# Patient Record
Sex: Female | Born: 1985 | Race: White | Hispanic: No | Marital: Married | Smoking: Former smoker
Health system: Southern US, Community
[De-identification: ages and names within clinical notes are randomized; demographics above are authoritative.]

## PROBLEM LIST (undated history)

## (undated) DIAGNOSIS — F419 Anxiety disorder, unspecified: Secondary | ICD-10-CM

## (undated) DIAGNOSIS — F32A Depression, unspecified: Secondary | ICD-10-CM

## (undated) DIAGNOSIS — T7840XA Allergy, unspecified, initial encounter: Secondary | ICD-10-CM

## (undated) DIAGNOSIS — R112 Nausea with vomiting, unspecified: Secondary | ICD-10-CM

## (undated) DIAGNOSIS — G43909 Migraine, unspecified, not intractable, without status migrainosus: Secondary | ICD-10-CM

## (undated) DIAGNOSIS — F329 Major depressive disorder, single episode, unspecified: Secondary | ICD-10-CM

## (undated) DIAGNOSIS — K219 Gastro-esophageal reflux disease without esophagitis: Secondary | ICD-10-CM

## (undated) DIAGNOSIS — Z9889 Other specified postprocedural states: Secondary | ICD-10-CM

## (undated) HISTORY — PX: FOOT TENDON SURGERY: SHX958

## (undated) HISTORY — DX: Depression, unspecified: F32.A

## (undated) HISTORY — DX: Allergy, unspecified, initial encounter: T78.40XA

## (undated) HISTORY — PX: ABDOMINAL HYSTERECTOMY: SHX81

## (undated) HISTORY — DX: Major depressive disorder, single episode, unspecified: F32.9

---

## 2008-11-27 ENCOUNTER — Inpatient Hospital Stay: Payer: Self-pay | Admitting: Obstetrics and Gynecology

## 2008-12-02 LAB — HM PAP SMEAR: HM Pap smear: NORMAL

## 2009-09-01 HISTORY — PX: DILATION AND CURETTAGE OF UTERUS: SHX78

## 2009-09-03 ENCOUNTER — Ambulatory Visit: Payer: Self-pay | Admitting: Unknown Physician Specialty

## 2009-09-03 HISTORY — PX: LAPAROSCOPY: SHX197

## 2010-03-14 ENCOUNTER — Emergency Department (HOSPITAL_COMMUNITY)
Admission: EM | Admit: 2010-03-14 | Discharge: 2010-03-15 | Payer: Self-pay | Source: Home / Self Care | Admitting: Emergency Medicine

## 2010-05-03 ENCOUNTER — Emergency Department (HOSPITAL_COMMUNITY)
Admission: EM | Admit: 2010-05-03 | Discharge: 2010-05-04 | Disposition: A | Discharge status: Home / Self Care | Payer: BC Managed Care – PPO | Attending: Emergency Medicine | Admitting: Emergency Medicine

## 2010-05-03 DIAGNOSIS — R112 Nausea with vomiting, unspecified: Secondary | ICD-10-CM | POA: Insufficient documentation

## 2010-05-03 DIAGNOSIS — R109 Unspecified abdominal pain: Secondary | ICD-10-CM | POA: Insufficient documentation

## 2010-05-03 DIAGNOSIS — K5900 Constipation, unspecified: Secondary | ICD-10-CM | POA: Insufficient documentation

## 2010-05-04 ENCOUNTER — Emergency Department (HOSPITAL_COMMUNITY)
Admit: 2010-05-04 | Discharge: 2010-05-04 | Disposition: A | Discharge status: Home / Self Care | Payer: BC Managed Care – PPO

## 2011-01-03 ENCOUNTER — Other Ambulatory Visit (HOSPITAL_COMMUNITY)
Admission: RE | Admit: 2011-01-03 | Discharge: 2011-01-03 | Disposition: A | Discharge status: Home / Self Care | Payer: BC Managed Care – PPO | Source: Ambulatory Visit | Attending: Internal Medicine | Admitting: Internal Medicine

## 2011-01-03 ENCOUNTER — Encounter: Payer: Self-pay | Admitting: Internal Medicine

## 2011-01-03 ENCOUNTER — Ambulatory Visit (INDEPENDENT_AMBULATORY_CARE_PROVIDER_SITE_OTHER): Payer: BC Managed Care – PPO | Admitting: Internal Medicine

## 2011-01-03 VITALS — BP 126/77 | HR 61 | Temp 97.6°F | Resp 16 | Ht 65.0 in | Wt 184.2 lb

## 2011-01-03 DIAGNOSIS — E669 Obesity, unspecified: Secondary | ICD-10-CM

## 2011-01-03 DIAGNOSIS — F329 Major depressive disorder, single episode, unspecified: Secondary | ICD-10-CM

## 2011-01-03 DIAGNOSIS — Z8669 Personal history of other diseases of the nervous system and sense organs: Secondary | ICD-10-CM | POA: Insufficient documentation

## 2011-01-03 DIAGNOSIS — Z01419 Encounter for gynecological examination (general) (routine) without abnormal findings: Secondary | ICD-10-CM | POA: Insufficient documentation

## 2011-01-03 DIAGNOSIS — Z0000 Encounter for general adult medical examination without abnormal findings: Secondary | ICD-10-CM

## 2011-01-03 DIAGNOSIS — Z3002 Counseling and instruction in natural family planning to avoid pregnancy: Secondary | ICD-10-CM

## 2011-01-03 DIAGNOSIS — Z1159 Encounter for screening for other viral diseases: Secondary | ICD-10-CM | POA: Insufficient documentation

## 2011-01-03 DIAGNOSIS — T7840XA Allergy, unspecified, initial encounter: Secondary | ICD-10-CM | POA: Insufficient documentation

## 2011-01-03 DIAGNOSIS — N760 Acute vaginitis: Secondary | ICD-10-CM | POA: Insufficient documentation

## 2011-01-03 DIAGNOSIS — K219 Gastro-esophageal reflux disease without esophagitis: Secondary | ICD-10-CM

## 2011-01-03 DIAGNOSIS — Z30018 Encounter for initial prescription of other contraceptives: Secondary | ICD-10-CM | POA: Insufficient documentation

## 2011-01-03 DIAGNOSIS — F32A Depression, unspecified: Secondary | ICD-10-CM

## 2011-01-03 DIAGNOSIS — G43909 Migraine, unspecified, not intractable, without status migrainosus: Secondary | ICD-10-CM

## 2011-01-03 DIAGNOSIS — Z124 Encounter for screening for malignant neoplasm of cervix: Secondary | ICD-10-CM | POA: Insufficient documentation

## 2011-01-03 LAB — TSH: TSH: 0.79 u[IU]/mL (ref 0.35–5.50)

## 2011-01-03 LAB — COMPREHENSIVE METABOLIC PANEL
ALT: 19 U/L (ref 0–35)
AST: 16 U/L (ref 0–37)
Alkaline Phosphatase: 37 U/L — ABNORMAL LOW (ref 39–117)
BUN: 5 mg/dL — ABNORMAL LOW (ref 6–23)
Creatinine, Ser: 0.6 mg/dL (ref 0.4–1.2)
Potassium: 3.2 mEq/L — ABNORMAL LOW (ref 3.5–5.1)

## 2011-01-03 LAB — LIPID PANEL
HDL: 60.2 mg/dL (ref >39.00)
LDL Cholesterol: 80 mg/dL (ref 0–99)
Total CHOL/HDL Ratio: 3
Triglycerides: 73 mg/dL (ref 0.0–149.0)

## 2011-01-03 NOTE — Patient Instructions (Signed)
EAS makes a protein shake premixed, that has 110 cal  3 carbs  17 g protein. Drink one    Atkins protein bars (snack sized) are full off iber,  Low in carbs  And 130-150 cal. Eat one between classes. Lunch should be a salad,  A sandwich on low carb bread  (try Joseph's pita bread availale at [facility]), yogurt or another protein bar.  Another snack in the afternoon.  Can be pistachios, almonds. 1/4 cup .   Dinner should be a meat (or fish or poultry) , a salad and a green vegetable , squash cabage,  cauliflowe ok)  Limit your corn on the cob to one ear bc of the sugar content.   You need to exercise for 25 minutes 4 or 5 times per week .  thsi can be power walking 9walking at a fast rate so yo are short of breath), jumping on a trampoline, jump roping, or going to a gym.   Losing 4 lbs per month is an excellent rate for a woman.

## 2011-01-03 NOTE — Assessment & Plan Note (Signed)
Patient is not exercising or eating regularly and has gained 30 lbs since the birth of her daughter 2 yrs ago.  Spent 15 minutes discussing role of diet and exercise and dissuaded her form use of appetite suppressants at this time.

## 2011-01-03 NOTE — Progress Notes (Signed)
  Subjective:    Patient ID: Jamie Weber, female    DOB: 1985-09-12, 25 y.o.   MRN: [id number]  HPI  25 yo wf here for annual physical with PAP. Has gained 30 lbs over the lst year and requesting help with medication.  Does not eat breakfast or lunch,  Eats only 1 meal daily.  Does not exercise.  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  Has a 66 yr old daughter , delivered Oct 2010, now using Implanon for birth control.     Review of Systems  Constitutional: Negative for fever, chills and unexpected weight change.  HENT: Negative for hearing loss, ear pain, nosebleeds, congestion, sore throat, facial swelling, rhinorrhea, sneezing, mouth sores, trouble swallowing, neck pain, neck stiffness, voice change, postnasal drip, sinus pressure, tinnitus and ear discharge.   Eyes: Negative for pain, discharge, redness and visual disturbance.  Respiratory: Negative for cough, chest tightness, shortness of breath, wheezing and stridor.   Cardiovascular: Negative for chest pain, palpitations and leg swelling.  Genitourinary: Positive for menstrual problem.  Musculoskeletal: Negative for myalgias and arthralgias.  Skin: Negative for color change and rash.  Neurological: Negative for dizziness, weakness, light-headedness and headaches.  Hematological: Negative for adenopathy.  All other systems reviewed and are negative.       Objective:   Physical Exam  Constitutional: She is oriented to person, place, and time. She appears well-developed and well-nourished.  HENT:  Mouth/Throat: Oropharynx is clear and moist.  Eyes: EOM are normal. Pupils are equal, round, and reactive to light. No scleral icterus.  Neck: Normal range of motion. Neck supple. No JVD present. No thyromegaly present.  Cardiovascular: Normal rate, regular rhythm, normal heart sounds and intact distal pulses.   Pulmonary/Chest: Effort  normal and breath sounds normal.  Abdominal: Soft. Bowel sounds are normal. She exhibits no mass. There is no tenderness.  Genitourinary: Vagina normal and uterus normal. No vaginal discharge found.  Musculoskeletal: Normal range of motion. She exhibits no edema.  Lymphadenopathy:    She has no cervical adenopathy.  Neurological: She is alert and oriented to person, place, and time.  Skin: Skin is warm and dry.     Psychiatric: She has a normal mood and affect.          Assessment & Plan:

## 2011-01-05 LAB — HEMOGLOBIN A1C: Hgb A1c MFr Bld: 5.5 % (ref 4.6–6.5)

## 2011-01-10 ENCOUNTER — Encounter: Payer: Self-pay | Admitting: Internal Medicine

## 2011-01-13 ENCOUNTER — Other Ambulatory Visit: Payer: Self-pay | Admitting: *Deleted

## 2011-01-13 MED ORDER — POTASSIUM CHLORIDE 20 MEQ PO PACK: 20.0000 meq | PACK | Freq: Every day | ORAL | Status: DC

## 2011-01-29 ENCOUNTER — Emergency Department: Payer: Self-pay | Admitting: Emergency Medicine

## 2011-02-27 ENCOUNTER — Telehealth: Payer: Self-pay | Admitting: Internal Medicine

## 2011-02-27 NOTE — Telephone Encounter (Signed)
Left message asking patient to return my call.

## 2011-02-27 NOTE — Telephone Encounter (Signed)
[phone] Pt called wanted to know if she needs to see dr Darrick Huntsman or chiropractor She got a cold 2 weeks ago she has gotten over that.  She went to bend over the tub to give her child a bath.  She has lower back pain and her right leg hurts like a throbbing pain.  When she cough her back hurts

## 2011-02-28 NOTE — Telephone Encounter (Signed)
Spoke with patient, she says that her back and leg is starting to feel some better and wants to give it a little more time. She says that if she is not better by Friday she is going to call and schedule appt to see Dr. Darrick Huntsman.

## 2011-03-01 ENCOUNTER — Encounter: Payer: Self-pay | Admitting: Internal Medicine

## 2011-03-01 ENCOUNTER — Ambulatory Visit (INDEPENDENT_AMBULATORY_CARE_PROVIDER_SITE_OTHER): Payer: BC Managed Care – PPO | Admitting: Internal Medicine

## 2011-03-01 DIAGNOSIS — M5432 Sciatica, left side: Secondary | ICD-10-CM

## 2011-03-01 DIAGNOSIS — M5430 Sciatica, unspecified side: Secondary | ICD-10-CM

## 2011-03-01 MED ORDER — GABAPENTIN 100 MG PO CAPS: 100.0000 mg | ORAL_CAPSULE | Freq: Three times a day (TID) | ORAL | Status: DC

## 2011-03-01 MED ORDER — POTASSIUM CHLORIDE ER 10 MEQ PO TBCR: 20.0000 meq | EXTENDED_RELEASE_TABLET | Freq: Two times a day (BID) | ORAL | Status: DC

## 2011-03-01 MED ORDER — TRAMADOL HCL 50 MG PO TABS: 50.0000 mg | ORAL_TABLET | Freq: Four times a day (QID) | ORAL | Status: AC | PRN

## 2011-03-01 MED ORDER — METHOCARBAMOL 750 MG PO TABS: 750.0000 mg | ORAL_TABLET | Freq: Three times a day (TID) | ORAL | Status: AC | PRN
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
## 2011-03-01 NOTE — Patient Instructions (Addendum)
Stop the aspirin and ibuprofen for now. They are hurting your stomach  Continue the omeprazole (prilosec) daily for your stomach.    I am going to prescribe a muscle relaxer (methocarbamol),  A pill for nerve irritation (gabapentin), and  tramadol for pain   For the next two weeks. You can combine these 3 medications any way you like, but the methocarbamol and the gabapentin might make you sleepy   If you have no improvement in 2 weeks, call to set up x rays.

## 2011-03-03 ENCOUNTER — Encounter: Payer: Self-pay | Admitting: Internal Medicine

## 2011-03-03 DIAGNOSIS — M5432 Sciatica, left side: Secondary | ICD-10-CM | POA: Insufficient documentation

## 2011-04-04 DIAGNOSIS — R112 Nausea with vomiting, unspecified: Secondary | ICD-10-CM

## 2011-04-04 DIAGNOSIS — Z9889 Other specified postprocedural states: Secondary | ICD-10-CM

## 2011-04-04 HISTORY — DX: Nausea with vomiting, unspecified: Z98.890

## 2011-04-04 HISTORY — DX: Nausea with vomiting, unspecified: R11.2

## 2011-08-15 ENCOUNTER — Telehealth: Payer: Self-pay | Admitting: Internal Medicine

## 2011-08-15 NOTE — Telephone Encounter (Signed)
Caller: Alisse/Patient; PCP: Duncan Dull; CB#: [phone]; ; ; Call regarding URI, Pink Eye; Eyes not improved, has fever and URI sx with nausea  Eye drops have not worked.  Insistent on appointment today.  Contacted Vanessa at office for assistance with appointment.  Was informed per Morrie Sheldon to refer to OB/GYN since she is pregnant.  Caller informed of same, states she was told to contact PCP by OB/GYN, so she will go to a walk in clinic.

## 2011-09-26 ENCOUNTER — Observation Stay: Payer: Self-pay

## 2011-09-26 ENCOUNTER — Observation Stay: Payer: Self-pay | Admitting: Obstetrics and Gynecology

## 2011-09-26 LAB — URINALYSIS, COMPLETE
Bacteria: NONE SEEN
Bilirubin,UR: NEGATIVE
Blood: NEGATIVE
Nitrite: NEGATIVE
Ph: 7 (ref 4.5–8.0)
Protein: NEGATIVE
Specific Gravity: 1.012 (ref 1.003–1.030)
WBC UR: <1 /HPF (ref 0–5)

## 2011-10-05 ENCOUNTER — Observation Stay: Payer: Self-pay | Admitting: Obstetrics and Gynecology

## 2011-10-05 LAB — URINALYSIS, COMPLETE
Bacteria: NONE SEEN
Bilirubin,UR: NEGATIVE
Glucose,UR: NEGATIVE mg/dL (ref 0–75)
Ketone: NEGATIVE
Leukocyte Esterase: NEGATIVE
Ph: 7 (ref 4.5–8.0)
Protein: NEGATIVE
RBC,UR: NONE SEEN /HPF (ref 0–5)
Squamous Epithelial: 1
WBC UR: 1 /HPF (ref 0–5)

## 2011-10-05 LAB — FETAL FIBRONECTIN: Fetal Fibronectin: NEGATIVE

## 2011-11-20 ENCOUNTER — Other Ambulatory Visit: Payer: Self-pay | Admitting: *Deleted

## 2011-11-20 DIAGNOSIS — K219 Gastro-esophageal reflux disease without esophagitis: Secondary | ICD-10-CM

## 2011-11-20 MED ORDER — OMEPRAZOLE 40 MG PO CPDR: 40.0000 mg | DELAYED_RELEASE_CAPSULE | Freq: Every day | ORAL | Status: DC

## 2011-11-27 ENCOUNTER — Observation Stay: Payer: Self-pay | Admitting: Obstetrics & Gynecology

## 2011-11-27 LAB — URINALYSIS, COMPLETE
Bilirubin,UR: NEGATIVE
Ketone: NEGATIVE
Ph: 6 (ref 4.5–8.0)
Protein: NEGATIVE
RBC,UR: 2 /HPF (ref 0–5)
Specific Gravity: 1.011 (ref 1.003–1.030)
Squamous Epithelial: 28
WBC UR: 10 /HPF (ref 0–5)

## 2011-11-27 LAB — FETAL FIBRONECTIN
Appearance: NORMAL
Fetal Fibronectin: NEGATIVE

## 2011-12-31 ENCOUNTER — Observation Stay: Payer: Self-pay

## 2012-01-24 ENCOUNTER — Inpatient Hospital Stay: Payer: Self-pay

## 2012-01-24 LAB — CBC WITH DIFFERENTIAL/PLATELET
Basophil #: 0.1 10*3/uL (ref 0.0–0.1)
Lymphocyte #: 2.1 10*3/uL (ref 1.0–3.6)
MCHC: 34.7 g/dL (ref 32.0–36.0)
MCV: 88 fL (ref 80–100)
Monocyte #: 0.6 x10 3/mm (ref 0.2–0.9)
Monocyte %: 6.4 %
Platelet: 141 10*3/uL — ABNORMAL LOW (ref 150–440)
RBC: 4.32 10*6/uL (ref 3.80–5.20)
RDW: 14 % (ref 11.5–14.5)
WBC: 9.8 10*3/uL (ref 3.6–11.0)

## 2012-01-25 LAB — HEMATOCRIT: HCT: 32.4 % — ABNORMAL LOW (ref 35.0–47.0)

## 2012-02-09 ENCOUNTER — Other Ambulatory Visit: Payer: Self-pay | Admitting: Internal Medicine

## 2012-02-09 MED ORDER — SUMATRIPTAN SUCCINATE 50 MG PO TABS: ORAL_TABLET | ORAL | Status: DC

## 2012-02-09 NOTE — Telephone Encounter (Signed)
Pt is needing refill on Imitrex and she uses [facility] on [address]

## 2012-02-09 NOTE — Telephone Encounter (Signed)
Rx sent electronically by Dr. Darrick Huntsman, patient advised via message left on machine home.

## 2012-02-09 NOTE — Addendum Note (Signed)
Addended by: Sherlene Shams on: 02/09/2012 12:55 PM   Modules accepted: Orders

## 2012-02-09 NOTE — Telephone Encounter (Signed)
Ok to refill,  Authorized in epic 

## 2012-02-12 ENCOUNTER — Telehealth: Payer: Self-pay | Admitting: Internal Medicine

## 2012-02-12 NOTE — Telephone Encounter (Signed)
Pt was wanting to start getting the Depo injection for birth control. She has a f/u and wanted to have it in her system before her next f/u. Please call [phone]

## 2012-02-12 NOTE — Telephone Encounter (Signed)
Scheduled patient for 02/13/12 at 11:00am

## 2012-02-12 NOTE — Telephone Encounter (Signed)
She will need a urine pregnancy test on the day of the test to confirm that she is not pregnant.  i will order test and shot she can make a nurse visit for both please confirm that we have the shot available to give.

## 2012-02-13 ENCOUNTER — Ambulatory Visit: Payer: BC Managed Care – PPO | Admitting: Internal Medicine

## 2012-02-13 DIAGNOSIS — Z309 Encounter for contraceptive management, unspecified: Secondary | ICD-10-CM

## 2012-02-13 LAB — POCT URINE PREGNANCY: Preg Test, Ur: NEGATIVE

## 2012-02-13 MED ORDER — MEDROXYPROGESTERONE ACETATE 150 MG/ML IM SUSP
150.0000 mg | Freq: Once | INTRAMUSCULAR | Status: AC
  Administered 2012-02-13: 150 mg via INTRAMUSCULAR

## 2012-05-14 ENCOUNTER — Ambulatory Visit: Payer: BC Managed Care – PPO

## 2012-05-14 ENCOUNTER — Ambulatory Visit (INDEPENDENT_AMBULATORY_CARE_PROVIDER_SITE_OTHER): Payer: BC Managed Care – PPO | Admitting: *Deleted

## 2012-05-14 DIAGNOSIS — IMO0001 Reserved for inherently not codable concepts without codable children: Secondary | ICD-10-CM

## 2012-05-14 DIAGNOSIS — Z309 Encounter for contraceptive management, unspecified: Secondary | ICD-10-CM

## 2012-05-14 MED ORDER — MEDROXYPROGESTERONE ACETATE 150 MG/ML IM SUSP
150.0000 mg | Freq: Once | INTRAMUSCULAR | Status: AC
  Administered 2012-05-14: 150 mg via INTRAMUSCULAR

## 2012-07-03 ENCOUNTER — Ambulatory Visit (INDEPENDENT_AMBULATORY_CARE_PROVIDER_SITE_OTHER): Payer: BC Managed Care – PPO | Admitting: Internal Medicine

## 2012-07-03 ENCOUNTER — Encounter: Payer: Self-pay | Admitting: Internal Medicine

## 2012-07-03 VITALS — BP 126/66 | HR 66 | Temp 98.6°F | Resp 18 | Ht 65.0 in | Wt 180.5 lb

## 2012-07-03 DIAGNOSIS — Z124 Encounter for screening for malignant neoplasm of cervix: Secondary | ICD-10-CM

## 2012-07-03 NOTE — Progress Notes (Signed)
Patient ID: Jamie Weber, female   DOB: 01/04/1986, 27 y.o.   MRN: [id number]   Subjective:     Jamie Weber is a 27 y.o. female here for a routine exam.   She is receiving quarterly depo provera injections for birth control .  She delivered a healthy baby girl 5 months ago,  and her periods have resumed but have been irregular since then.  This is not an unusual situation for her.  She has had a history of irregular menses since menarche and has had multiple gynecologic evaluations for same.     Personal health questionnaire reviewed: yes.   Gynecologic History Patient's last menstrual period was 06/01/2012. Contraception: Depo-Provera injections Last Pap: 2013. Results were: normal Last mammogram: never .   Obstetric History OB History   Grav Para Term Preterm Abortions TAB SAB Ect Mult Living   2 2               The following portions of the patient's history were reviewed and updated as appropriate: allergies, current medications, past family history, past medical history, past social history, past surgical history and problem list.  Review of Systems A comprehensive review of systems was negative.    Objective:    BP 126/66  Pulse 66  Temp(Src) 98.6 F (37 C) (Oral)  Resp 18  Ht 5\' 5"  (1.651 m)  Wt 180 lb 8 oz (81.874 kg)  BMI 30.04 kg/m2  SpO2 97%  LMP 06/01/2012  General Appearance:    Alert, cooperative, no distress, appears stated age  Head:    Normocephalic, without obvious abnormality, atraumatic  Eyes:    PERRL, conjunctiva/corneas clear, EOM's intact, fundi    benign, both eyes  Ears:    Normal TM's and external ear canals, both ears  Nose:   Nares normal, septum midline, mucosa normal, no drainage    or sinus tenderness  Throat:   Lips, mucosa, and tongue normal; teeth and gums normal  Neck:   Supple, symmetrical, trachea midline, no adenopathy;    thyroid:  no enlargement/tenderness/nodules; no carotid   bruit or JVD  Back:     Symmetric, no  curvature, ROM normal, no CVA tenderness  Lungs:     Clear to auscultation bilaterally, respirations unlabored  Chest Wall:    No tenderness or deformity   Heart:    Regular rate and rhythm, S1 and S2 normal, no murmur, rub   or gallop  Breast Exam:    No tenderness, masses, or nipple abnormality  Abdomen:     Soft, non-tender, bowel sounds active all four quadrants,    no masses, no organomegaly  Genitalia:    Normal female without lesion, discharge or tenderness  Rectal:    Normal tone, normal prostate, no masses or tenderness;   guaiac negative stool  Extremities:   Extremities normal, atraumatic, no cyanosis or edema  Pulses:   2+ and symmetric all extremities  Skin:   Skin color, texture, turgor normal, no rashes or lesions  Lymph nodes:   Cervical, supraclavicular, and axillary nodes normal  Neurologic:   CNII-XII intact, normal strength, sensation and reflexes    throughout      Assessment:    Healthy female exam.    Plan:    Contraception: Continue Depo-Provera injections.

## 2012-07-04 ENCOUNTER — Other Ambulatory Visit (HOSPITAL_COMMUNITY)
Admission: RE | Admit: 2012-07-04 | Discharge: 2012-07-04 | Disposition: A | Discharge status: Home / Self Care | Payer: BC Managed Care – PPO | Source: Ambulatory Visit | Attending: Internal Medicine | Admitting: Internal Medicine

## 2012-07-04 DIAGNOSIS — Z01419 Encounter for gynecological examination (general) (routine) without abnormal findings: Secondary | ICD-10-CM | POA: Insufficient documentation

## 2012-07-04 DIAGNOSIS — Z1151 Encounter for screening for human papillomavirus (HPV): Secondary | ICD-10-CM | POA: Insufficient documentation

## 2012-07-09 ENCOUNTER — Encounter: Payer: Self-pay | Admitting: *Deleted

## 2012-08-01 ENCOUNTER — Emergency Department: Payer: Self-pay | Admitting: Emergency Medicine

## 2012-08-01 LAB — URINALYSIS, COMPLETE
Bilirubin,UR: NEGATIVE
Nitrite: NEGATIVE
Protein: 100
Specific Gravity: 1.025 (ref 1.003–1.030)
Squamous Epithelial: 3
WBC UR: 5 /HPF (ref 0–5)

## 2012-08-01 LAB — CBC
HGB: 13.9 g/dL (ref 12.0–16.0)
MCHC: 33.7 g/dL (ref 32.0–36.0)
MCV: 88 fL (ref 80–100)
WBC: 7.1 10*3/uL (ref 3.6–11.0)

## 2012-08-06 ENCOUNTER — Encounter: Payer: Self-pay | Admitting: Internal Medicine

## 2012-08-06 ENCOUNTER — Ambulatory Visit (INDEPENDENT_AMBULATORY_CARE_PROVIDER_SITE_OTHER): Payer: BC Managed Care – PPO | Admitting: Internal Medicine

## 2012-08-06 ENCOUNTER — Ambulatory Visit: Payer: BC Managed Care – PPO

## 2012-08-06 VITALS — BP 124/68 | HR 60 | Temp 98.6°F | Resp 14 | Wt 185.2 lb

## 2012-08-06 DIAGNOSIS — Z30018 Encounter for initial prescription of other contraceptives: Secondary | ICD-10-CM

## 2012-08-06 DIAGNOSIS — Z309 Encounter for contraceptive management, unspecified: Secondary | ICD-10-CM

## 2012-08-06 DIAGNOSIS — F411 Generalized anxiety disorder: Secondary | ICD-10-CM

## 2012-08-06 DIAGNOSIS — Z3002 Counseling and instruction in natural family planning to avoid pregnancy: Secondary | ICD-10-CM

## 2012-08-06 MED ORDER — ALPRAZOLAM 0.5 MG PO TABS: 0.5000 mg | ORAL_TABLET | Freq: Every evening | ORAL | Status: DC | PRN

## 2012-08-06 MED ORDER — CITALOPRAM HYDROBROMIDE 10 MG PO TABS: 10.0000 mg | ORAL_TABLET | Freq: Every day | ORAL | Status: DC

## 2012-08-06 MED ORDER — MEDROXYPROGESTERONE ACETATE 150 MG/ML IM SUSP
150.0000 mg | Freq: Once | INTRAMUSCULAR | Status: AC
  Administered 2012-08-06: 150 mg via INTRAMUSCULAR

## 2012-08-06 NOTE — Progress Notes (Signed)
Patient ID: Jamie Weber, female   DOB: [birth year], 27 y.o.   MRN: [id number]  Patient Active Problem List   Diagnosis Date Noted  . Generalized anxiety disorder 08/07/2012  . Sciatica of left side 03/03/2011  . Hormonal contraceptive 01/03/2011  . Obesity 01/03/2011  . Pap smear for cervical cancer screening 01/03/2011  . Obesity (BMI 30-39.9) 01/03/2011  . Hormonal contraceptive 01/03/2011  . Allergy   . Depression   . History of migraine headaches     Subjective:  CC:   Chief Complaint  Patient presents with  . Follow-up    depo    HPI:   Jamie Weber a 27 y.o. female who presents with recurrent anxiety disorder.  She has had frequent panic attacks which are occurring at night brought on by a feeling of heaviness in the chest, accompanied by breathlessness.   Not relieved by walking around.  She is requesting medication because she feels she has inherited her family's propensity for anxiety.  Her first epsiode occurred in 2012 while she was enrolled in school and taking care of baby, and involved in family conflicts and struggles.   Past Medical History  Diagnosis Date  . Allergy     history of hives controoled with zyrtec  . Depression     Past Surgical History  Procedure Laterality Date  . Laparoscopy  September 03, 2009    hysteroscopy and D&C, Dr. Harold Hedge  . Dilation and curettage of uterus  Jun 2011    normal,. [facility]    The following portions of the patient's history were reviewed and updated as appropriate: Allergies, current medications, and problem list   Review of Systems:   Patient denies headache, fevers, malaise, unintentional weight loss, skin rash, eye pain, sinus congestion and sinus pain, sore throat, dysphagia,  hemoptysis , cough, dyspnea, wheezing, chest pain, palpitations, orthopnea, edema, abdominal pain, nausea, melena, diarrhea, constipation, flank pain, dysuria, hematuria, urinary  Frequency, nocturia, numbness, tingling, seizures,  Focal  weakness, Loss of consciousness,  Tremor, insomnia, depression, anxiety, and suicidal ideation.     History   Social History  . Marital Status: Married    Spouse Name: N/A    Number of Children: N/A  . Years of Education: N/A   Occupational History  . Not on file.   Social History Main Topics  . Smoking status: Former Smoker    Quit date: 10/03/2010  . Smokeless tobacco: Never Used  . Alcohol Use: Yes     Comment: occasional  . Drug Use: No  . Sexually Active: Yes   Other Topics Concern  . Not on file   Social History Narrative  . No narrative on file    Objective:  BP 124/68  Pulse 60  Temp(Src) 98.6 F (37 C) (Oral)  Resp 14  Wt 185 lb 4 oz (84.029 kg)  BMI 30.83 kg/m2  SpO2 98%  LMP 08/06/2012  General appearance: alert, cooperative and appears stated age Ears: normal TM's and external ear canals both ears Throat: lips, mucosa, and tongue normal; teeth and gums normal Neck: no adenopathy, no carotid bruit, supple, symmetrical, trachea midline and thyroid not enlarged, symmetric, no tenderness/mass/nodules Back: symmetric, no curvature. ROM normal. No CVA tenderness. Lungs: clear to auscultation bilaterally Heart: regular rate and rhythm, S1, S2 normal, no murmur, click, rub or gallop Abdomen: soft, non-tender; bowel sounds normal; no masses,  no organomegaly Pulses: 2+ and symmetric Skin: Skin color, texture, turgor normal. No rashes or lesions Lymph nodes: Cervical, supraclavicular, and  axillary nodes normal.  Assessment and Plan:  Generalized anxiety disorder With frequent panic attacks occurring in the evening.  Trial of alprazolam.  Continue citalopram  Hormonal contraceptive Managed currently with Dep provera IM injections every 4 months    Updated Medication List Outpatient Encounter Prescriptions as of 08/06/2012  Medication Sig Dispense Refill  . omeprazole (PRILOSEC) 40 MG capsule Take 1 capsule (40 mg total) by mouth daily.  30 capsule  6   . SUMAtriptan (IMITREX) 50 MG tablet 1 tablet as needed at onset of headache. Max 2 daily  15 tablet  5  . ALPRAZolam (XANAX) 0.5 MG tablet Take 1 tablet (0.5 mg total) by mouth at bedtime as needed for anxiety.  30 tablet  3  . cetirizine (ZYRTEC) 10 MG tablet Take 10 mg by mouth daily.        . citalopram (CELEXA) 10 MG tablet Take 1 tablet (10 mg total) by mouth daily.  30 tablet  3  . gabapentin (NEURONTIN) 100 MG capsule Take 1 capsule (100 mg total) by mouth 3 (three) times daily.  90 capsule  0  . potassium chloride (K-DUR) 10 MEQ tablet Take 2 tablets (20 mEq total) by mouth 2 (two) times daily.  10 tablet  0  . [EXPIRED] medroxyPROGESTERone (DEPO-PROVERA) injection 150 mg        No facility-administered encounter medications on file as of 08/06/2012.

## 2012-08-06 NOTE — Patient Instructions (Addendum)
Please start taking citalopram in the morning with your stoamch pill.  Start with 1/2 tablet for thr first few days  Save the alprazolam for panic attacks.  Use tylenol PM for insomnia if needed

## 2012-08-07 ENCOUNTER — Encounter: Payer: Self-pay | Admitting: Internal Medicine

## 2012-08-07 DIAGNOSIS — F411 Generalized anxiety disorder: Secondary | ICD-10-CM | POA: Insufficient documentation

## 2012-08-07 NOTE — Assessment & Plan Note (Signed)
Managed currently with Dep provera IM injections every 4 months

## 2012-08-07 NOTE — Assessment & Plan Note (Signed)
With frequent panic attacks occurring in the evening.  Trial of alprazolam.  Continue citalopram

## 2012-09-07 ENCOUNTER — Other Ambulatory Visit: Payer: Self-pay | Admitting: Internal Medicine

## 2012-10-15 ENCOUNTER — Ambulatory Visit: Payer: BC Managed Care – PPO | Admitting: Adult Health

## 2012-11-01 ENCOUNTER — Ambulatory Visit: Payer: BC Managed Care – PPO

## 2012-11-07 ENCOUNTER — Ambulatory Visit (INDEPENDENT_AMBULATORY_CARE_PROVIDER_SITE_OTHER): Payer: BC Managed Care – PPO | Admitting: *Deleted

## 2012-11-07 DIAGNOSIS — Z309 Encounter for contraceptive management, unspecified: Secondary | ICD-10-CM

## 2012-11-07 MED ORDER — MEDROXYPROGESTERONE ACETATE 150 MG/ML IM SUSP
150.0000 mg | Freq: Once | INTRAMUSCULAR | Status: AC
  Administered 2012-11-07: 150 mg via INTRAMUSCULAR

## 2012-12-04 ENCOUNTER — Other Ambulatory Visit: Payer: Self-pay | Admitting: Internal Medicine

## 2012-12-04 MED ORDER — SUMATRIPTAN SUCCINATE 50 MG PO TABS: ORAL_TABLET | ORAL | Status: DC

## 2012-12-04 NOTE — Telephone Encounter (Signed)
Ok to refill? Last OV 5/14

## 2012-12-04 NOTE — Telephone Encounter (Signed)
Ok to refill, sent

## 2012-12-04 NOTE — Telephone Encounter (Signed)
Pt states she was told she could not get refills until she is seen by Dr. Darrick Huntsman.  Pt has appt scheduled 9/9 for this.  Pt states she is completely out of her imitrex and needs this now and cannot wait until the appt 9/9.  Asking for enough to get her to that appointment.

## 2012-12-09 ENCOUNTER — Encounter: Payer: Self-pay | Admitting: *Deleted

## 2012-12-10 ENCOUNTER — Encounter: Payer: Self-pay | Admitting: Internal Medicine

## 2012-12-10 ENCOUNTER — Ambulatory Visit (INDEPENDENT_AMBULATORY_CARE_PROVIDER_SITE_OTHER): Payer: BC Managed Care – PPO | Admitting: Internal Medicine

## 2012-12-10 VITALS — BP 108/70 | HR 67 | Temp 98.6°F | Resp 12 | Ht 65.0 in | Wt 188.0 lb

## 2012-12-10 DIAGNOSIS — Z13220 Encounter for screening for lipoid disorders: Secondary | ICD-10-CM

## 2012-12-10 DIAGNOSIS — F411 Generalized anxiety disorder: Secondary | ICD-10-CM

## 2012-12-10 DIAGNOSIS — Z8669 Personal history of other diseases of the nervous system and sense organs: Secondary | ICD-10-CM

## 2012-12-10 DIAGNOSIS — R5381 Other malaise: Secondary | ICD-10-CM

## 2012-12-10 LAB — LIPID PANEL: HDL: 57.6 mg/dL (ref >39.00)

## 2012-12-10 LAB — CBC WITH DIFFERENTIAL/PLATELET
Basophils Absolute: 0.1 10*3/uL (ref 0.0–0.1)
Eosinophils Relative: 2.1 % (ref 0.0–5.0)
Hemoglobin: 14.2 g/dL (ref 12.0–15.0)
Lymphocytes Relative: 27.8 % (ref 12.0–46.0)
Monocytes Relative: 7.7 % (ref 3.0–12.0)
Neutro Abs: 3.7 10*3/uL (ref 1.4–7.7)
RBC: 4.75 Mil/uL (ref 3.87–5.11)
RDW: 13.5 % (ref 11.5–14.6)
WBC: 6.1 10*3/uL (ref 4.5–10.5)

## 2012-12-10 LAB — COMPREHENSIVE METABOLIC PANEL
BUN: 10 mg/dL (ref 6–23)
CO2: 27 mEq/L (ref 19–32)
Calcium: 9.5 mg/dL (ref 8.4–10.5)
Chloride: 108 mEq/L (ref 96–112)
Creatinine, Ser: 0.7 mg/dL (ref 0.4–1.2)
GFR: 117.84 mL/min (ref >60.00)
Glucose, Bld: 98 mg/dL (ref 70–99)

## 2012-12-10 MED ORDER — AMITRIPTYLINE HCL 25 MG PO TABS: 25.0000 mg | ORAL_TABLET | Freq: Every day | ORAL | Status: DC

## 2012-12-10 MED ORDER — ALPRAZOLAM 0.5 MG PO TABS: 0.5000 mg | ORAL_TABLET | Freq: Every evening | ORAL | Status: DC | PRN

## 2012-12-10 MED ORDER — CITALOPRAM HYDROBROMIDE 20 MG PO TABS: 20.0000 mg | ORAL_TABLET | Freq: Every day | ORAL | Status: DC

## 2012-12-10 MED ORDER — SUMATRIPTAN SUCCINATE 50 MG PO TABS: ORAL_TABLET | ORAL | Status: DC

## 2012-12-10 NOTE — Patient Instructions (Addendum)
Take the citalopram in the morning,  Increase the dose to 20 mg daily for your  anxiety disorder  continue the alprazolam as needed for panic attacks.    We are starting elavil for headache prevention . Start with 25 mg at bedtime for week 1.  Increase to 50 mg at bedtime on week 2,  Then 75 mg week 3    I will petition the insurance company , but I need a "prior authorization " for imitrex form pharmacy

## 2012-12-10 NOTE — Assessment & Plan Note (Addendum)
She is running out of her Imitrex early because she is only are not 9 pills per month. 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 There is no history of snoring. Regardless I will start Elavil nightly for headache prevention. We will titrate her dose up by 25 mg weekly and see her back in 3 weeks.

## 2012-12-10 NOTE — Progress Notes (Signed)
Patient ID: Jamie Weber, female   DOB: 1986/03/29, 27 y.o.   MRN: [id number]  Patient Active Problem List   Diagnosis Date Noted  . Generalized anxiety disorder 08/07/2012  . Sciatica of left side 03/03/2011  . Hormonal contraceptive 01/03/2011  . Pap smear for cervical cancer screening 01/03/2011  . Obesity (BMI 30-39.9) 01/03/2011  . Allergy   . Depression   . History of migraine headaches     Subjective:  CC:   Chief Complaint  Patient presents with  . discuss citalopram and migraines    HPI:   Jamie Weber a 27 y.o. female who presents Follow up on GAD with panic attacks.:  She has been prescribed citalopram and prn alprazolam since May .  Despite regular use of this medication she cannot recall which one she is taking one. Her she states that the one that she takes daily and (The citalopram) is not working.  She states that she is Having panic attacks 2 to 3 times per day.  The increase in frequency Occurred after losing a close friend, 2 months ago but she states that she has had panic attacks intermittently for 2 years.  However after further discussion she states that she has had only 2 attacks in the past weeks,  and both occurred because she has run out of both prescriptions.  No specific triggers or time of day.  Sleeps like a rock.      Migraines:  Having more than 9 per month,  Triggered by night time driving when she sees the  headlights of cars,   And triggered by glare, and light with sudden onset of eye pain and throbbing and everything bothers her .only imitrex works,. Takes night classes twice weekly. Takes her sungalssess to school to deal with the glare of the projector.  Running out of  imitrex early every month    Past Medical History  Diagnosis Date  . Allergy     history of hives controoled with zyrtec  . Depression     Past Surgical History  Procedure Laterality Date  . Laparoscopy  September 03, 2009    hysteroscopy and D&C, Dr. Harold Hedge  . Dilation  and curettage of uterus  Jun 2011    normal,. [facility]       The following portions of the patient's history were reviewed and updated as appropriate: Allergies, current medications, and problem list.    Review of Systems:   12 Pt  review of systems was negative except those addressed in the HPI,     History   Social History  . Marital Status: Married    Spouse Name: N/A    Number of Children: N/A  . Years of Education: N/A   Occupational History  . Not on file.   Social History Main Topics  . Smoking status: Former Smoker    Quit date: 10/03/2010  . Smokeless tobacco: Never Used  . Alcohol Use: Yes     Comment: occasional  . Drug Use: No  . Sexual Activity: Yes   Other Topics Concern  . Not on file   Social History Narrative  . No narrative on file    Objective:  Filed Vitals:   12/10/12 0946  BP: 108/70  Pulse: 67  Temp: 98.6 F (37 C)  Resp: 12     General appearance: alert, cooperative and appears stated age Ears: normal TM's and external ear canals both ears Throat: lips, mucosa, and tongue normal; teeth and gums normal Neck:  no adenopathy, no carotid bruit, supple, symmetrical, trachea midline and thyroid not enlarged, symmetric, no tenderness/mass/nodules Back: symmetric, no curvature. ROM normal. No CVA tenderness. Lungs: clear to auscultation bilaterally Heart: regular rate and rhythm, S1, S2 normal, no murmur, click, rub or gallop Abdomen: soft, non-tender; bowel sounds normal; no masses,  no organomegaly Pulses: 2+ and symmetric Skin: Skin color, texture, turgor normal. No rashes or lesions Lymph nodes: Cervical, supraclavicular, and axillary nodes normal.  Assessment and Plan:  Generalized anxiety disorder Symptoms have recurred since running out of the alprazolam and the citalopram. She states that when she was taking both she was still having panic attacks but they were less frequent. I decided to increase the citalopram to 20 mg  daily and continue when necessary use of alprazolam not more than once daily.  History of migraine headaches She is running out of her Imitrex early because she is only are not 9 pills per month. Many of her headaches are triggered by driving at night and the lighting of the classroom that she is having her night courses taken him. Regardless I will start Elavil nightly for headache prevention. We will titrate her dose up by 25 mg weekly and see her back in 3 weeks.    Updated Medication List Outpatient Encounter Prescriptions as of 12/10/2012  Medication Sig Dispense Refill  . ALPRAZolam (XANAX) 0.5 MG tablet Take 1 tablet (0.5 mg total) by mouth at bedtime as needed for anxiety.  30 tablet  3  . ALPRAZolam (XANAX) 0.5 MG tablet Take 0.5 mg by mouth at bedtime as needed for anxiety.      . citalopram (CELEXA) 20 MG tablet Take 1 tablet (20 mg total) by mouth daily.  30 tablet  5  . omeprazole (PRILOSEC) 40 MG capsule TAKE 1 CAPSULE BY MOUTH EVERY DAY  30 capsule  5  . SUMAtriptan (IMITREX) 50 MG tablet 1 tablet as needed at onset of headache. Max 2 daily  15 tablet  5  . [DISCONTINUED] ALPRAZolam (XANAX) 0.5 MG tablet Take 1 tablet (0.5 mg total) by mouth at bedtime as needed for anxiety.  30 tablet  3  . [DISCONTINUED] citalopram (CELEXA) 10 MG tablet Take 1 tablet (10 mg total) by mouth daily.  30 tablet  3  . [DISCONTINUED] SUMAtriptan (IMITREX) 50 MG tablet 1 tablet as needed at onset of headache. Max 2 daily  15 tablet  0  . amitriptyline (ELAVIL) 25 MG tablet Take 1 tablet (25 mg total) by mouth at bedtime. Increase weekly by 25 mg to maxium dose 100 mg  90 tablet  0  . [DISCONTINUED] cetirizine (ZYRTEC) 10 MG tablet Take 10 mg by mouth daily.        . [DISCONTINUED] gabapentin (NEURONTIN) 100 MG capsule Take 1 capsule (100 mg total) by mouth 3 (three) times daily.  90 capsule  0  . [DISCONTINUED] potassium chloride (K-DUR) 10 MEQ tablet Take 2 tablets (20 mEq total) by mouth 2 (two) times  daily.  10 tablet  0   No facility-administered encounter medications on file as of 12/10/2012.     Orders Placed This Encounter  Procedures  . Comprehensive metabolic panel  . CBC with Differential  . TSH  . Lipid panel    No Follow-up on file.

## 2012-12-10 NOTE — Assessment & Plan Note (Signed)
Symptoms have recurred since running out of the alprazolam and the citalopram. She states that when she was taking both she was still having panic attacks but they were less frequent. I decided to increase the citalopram to 20 mg daily and continue when necessary use of alprazolam not more than once daily.

## 2012-12-12 ENCOUNTER — Encounter: Payer: Self-pay | Admitting: *Deleted

## 2012-12-23 ENCOUNTER — Encounter: Payer: Self-pay | Admitting: Internal Medicine

## 2012-12-31 ENCOUNTER — Ambulatory Visit: Payer: BC Managed Care – PPO | Admitting: Internal Medicine

## 2013-01-05 ENCOUNTER — Other Ambulatory Visit: Payer: Self-pay | Admitting: Internal Medicine

## 2013-01-06 NOTE — Telephone Encounter (Signed)
Okay to refill? Looks like patient was suppose to follow-up on 9/30 for a 3 wk f/u since starting on the Elavil (no showed). Please advise

## 2013-01-08 NOTE — Telephone Encounter (Signed)
Do not refill,. Since she no showed

## 2013-01-09 NOTE — Telephone Encounter (Signed)
Left patient a voicemail on mobile number to reschedule missed appointment in order to receive refills on the Elavil.

## 2013-01-30 ENCOUNTER — Ambulatory Visit: Payer: BC Managed Care – PPO

## 2013-01-31 ENCOUNTER — Ambulatory Visit: Payer: BC Managed Care – PPO

## 2013-04-30 ENCOUNTER — Other Ambulatory Visit: Payer: Self-pay | Admitting: Internal Medicine

## 2013-04-30 NOTE — Telephone Encounter (Signed)
Last visit 12/10/12, ok refill?

## 2013-04-30 NOTE — Telephone Encounter (Signed)
Ok to refill,  printed rx  Will need OV in April

## 2013-05-04 ENCOUNTER — Emergency Department: Payer: Self-pay | Admitting: Emergency Medicine

## 2013-05-04 LAB — COMPREHENSIVE METABOLIC PANEL
ANION GAP: 2 — AB (ref 7–16)
AST: 20 U/L (ref 15–37)
Albumin: 3.9 g/dL (ref 3.4–5.0)
Alkaline Phosphatase: 47 U/L
BILIRUBIN TOTAL: 0.3 mg/dL (ref 0.2–1.0)
BUN: 9 mg/dL (ref 7–18)
CO2: 29 mmol/L (ref 21–32)
CREATININE: 0.69 mg/dL (ref 0.60–1.30)
Calcium, Total: 9 mg/dL (ref 8.5–10.1)
Chloride: 107 mmol/L (ref 98–107)
EGFR (Non-African Amer.): >60
Glucose: 88 mg/dL (ref 65–99)
Osmolality: 274 (ref 275–301)
Potassium: 3.7 mmol/L (ref 3.5–5.1)
SGPT (ALT): 22 U/L (ref 12–78)
SODIUM: 138 mmol/L (ref 136–145)
Total Protein: 7.4 g/dL (ref 6.4–8.2)

## 2013-05-04 LAB — CBC WITH DIFFERENTIAL/PLATELET
Basophil #: 0.1 10*3/uL (ref 0.0–0.1)
Basophil %: 1.1 %
Eosinophil #: 0.2 10*3/uL (ref 0.0–0.7)
Eosinophil %: 2.6 %
HCT: 42.1 % (ref 35.0–47.0)
HGB: 14.4 g/dL (ref 12.0–16.0)
LYMPHS PCT: 29.1 %
Lymphocyte #: 2.1 10*3/uL (ref 1.0–3.6)
MCH: 30.7 pg (ref 26.0–34.0)
MCHC: 34.2 g/dL (ref 32.0–36.0)
MCV: 90 fL (ref 80–100)
MONO ABS: 0.5 x10 3/mm (ref 0.2–0.9)
Monocyte %: 7.3 %
Neutrophil #: 4.2 10*3/uL (ref 1.4–6.5)
Neutrophil %: 59.9 %
Platelet: 258 10*3/uL (ref 150–440)
RBC: 4.69 10*6/uL (ref 3.80–5.20)
RDW: 13.1 % (ref 11.5–14.5)
WBC: 7 10*3/uL (ref 3.6–11.0)

## 2013-05-04 LAB — URINALYSIS, COMPLETE
Bilirubin,UR: NEGATIVE
Blood: NEGATIVE
Glucose,UR: NEGATIVE mg/dL (ref 0–75)
Ketone: NEGATIVE
NITRITE: NEGATIVE
PROTEIN: NEGATIVE
Ph: 6 (ref 4.5–8.0)
RBC,UR: 2 /HPF (ref 0–5)
Specific Gravity: 1.017 (ref 1.003–1.030)
Squamous Epithelial: 14
WBC UR: 9 /HPF (ref 0–5)

## 2013-05-04 LAB — LIPASE, BLOOD: Lipase: 91 U/L (ref 73–393)

## 2013-05-06 LAB — URINE CULTURE

## 2013-05-21 ENCOUNTER — Other Ambulatory Visit: Payer: Self-pay | Admitting: Internal Medicine

## 2013-06-02 ENCOUNTER — Ambulatory Visit: Payer: Self-pay | Admitting: Unknown Physician Specialty

## 2013-06-06 ENCOUNTER — Ambulatory Visit: Payer: Self-pay | Admitting: Unknown Physician Specialty

## 2013-06-12 LAB — PATHOLOGY REPORT

## 2013-07-04 ENCOUNTER — Encounter: Payer: BC Managed Care – PPO | Admitting: Internal Medicine

## 2013-07-10 ENCOUNTER — Encounter: Payer: Self-pay | Admitting: Internal Medicine

## 2013-07-25 ENCOUNTER — Other Ambulatory Visit: Payer: Self-pay | Admitting: Internal Medicine

## 2013-07-25 ENCOUNTER — Ambulatory Visit (INDEPENDENT_AMBULATORY_CARE_PROVIDER_SITE_OTHER): Payer: BC Managed Care – PPO

## 2013-07-25 DIAGNOSIS — Z79899 Other long term (current) drug therapy: Secondary | ICD-10-CM

## 2013-07-25 DIAGNOSIS — Z309 Encounter for contraceptive management, unspecified: Secondary | ICD-10-CM

## 2013-07-25 MED ORDER — MEDROXYPROGESTERONE ACETATE 150 MG/ML IM SUSP
150.0000 mg | Freq: Once | INTRAMUSCULAR | Status: AC
  Administered 2013-07-25: 150 mg via INTRAMUSCULAR

## 2013-07-28 ENCOUNTER — Encounter: Payer: Self-pay | Admitting: *Deleted

## 2013-07-28 LAB — COMPREHENSIVE METABOLIC PANEL
ALK PHOS: 36 U/L — AB (ref 39–117)
ALT: 14 U/L (ref 0–35)
AST: 16 U/L (ref 0–37)
Albumin: 4.1 g/dL (ref 3.5–5.2)
BUN: 9 mg/dL (ref 6–23)
CO2: 25 mEq/L (ref 19–32)
CREATININE: 0.6 mg/dL (ref 0.4–1.2)
Calcium: 9.2 mg/dL (ref 8.4–10.5)
Chloride: 105 mEq/L (ref 96–112)
GFR: 142.18 mL/min (ref >60.00)
Glucose, Bld: 92 mg/dL (ref 70–99)
POTASSIUM: 3.9 meq/L (ref 3.5–5.1)
Sodium: 139 mEq/L (ref 135–145)
Total Bilirubin: 0.6 mg/dL (ref 0.3–1.2)
Total Protein: 6.6 g/dL (ref 6.0–8.3)

## 2013-08-07 ENCOUNTER — Encounter (INDEPENDENT_AMBULATORY_CARE_PROVIDER_SITE_OTHER): Payer: Self-pay

## 2013-08-07 ENCOUNTER — Encounter: Payer: Self-pay | Admitting: Internal Medicine

## 2013-08-07 ENCOUNTER — Ambulatory Visit (INDEPENDENT_AMBULATORY_CARE_PROVIDER_SITE_OTHER): Payer: BC Managed Care – PPO | Admitting: Internal Medicine

## 2013-08-07 VITALS — BP 102/60 | HR 59 | Temp 98.6°F | Ht 65.0 in | Wt 201.0 lb

## 2013-08-07 DIAGNOSIS — F411 Generalized anxiety disorder: Secondary | ICD-10-CM

## 2013-08-07 DIAGNOSIS — Z0000 Encounter for general adult medical examination without abnormal findings: Secondary | ICD-10-CM

## 2013-08-07 DIAGNOSIS — E669 Obesity, unspecified: Secondary | ICD-10-CM

## 2013-08-07 MED ORDER — ALPRAZOLAM 0.5 MG PO TABS: 0.5000 mg | ORAL_TABLET | Freq: Every evening | ORAL | Status: DC | PRN

## 2013-08-07 MED ORDER — AMITRIPTYLINE HCL 25 MG PO TABS
25.0000 mg | ORAL_TABLET | Freq: Every day | ORAL | Status: DC
Start: 2013-08-07 — End: 2014-12-21

## 2013-08-07 NOTE — Progress Notes (Signed)
Pre visit review using our clinic review tool, if applicable. No additional management support is needed unless otherwise documented below in the visit note. 

## 2013-08-07 NOTE — Progress Notes (Signed)
Patient ID: Jamie Weber, female   DOB: December 28, 1985, 28 y.o.   MRN: [id number]  Subjective:     Jamie PrimeHeather Weber is a 28 y.o. female and is here for a comprehensive physical exam. The patient reports increased stressors. .  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  Has lost 40 lbs of 70 gained due to stress eating,  Running daily      History   Social History  . Marital Status: Married    Spouse Name: N/A    Number of Children: N/A  . Years of Education: N/A   Occupational History  . Not on file.   Social History Main Topics  . Smoking status: Former Smoker    Quit date: 10/03/2010  . Smokeless tobacco: Never Used  . Alcohol Use: Yes     Comment: occasional  . Drug Use: No  . Sexual Activity: Yes   Other Topics Concern  . Not on file   Social History Narrative  . No narrative on file   Health Maintenance  Topic Date Due  . Tetanus/tdap  09/24/2006  . Influenza Vaccine  11/01/2013  . Pap Smear  07/09/2015    The following portions of the patient's history were reviewed and updated as appropriate: allergies, current medications, past family history, past medical history, past social history, past surgical history and problem list.  Review of Systems A comprehensive review of systems was negative.   Objective:   BP 102/60  Pulse 59  Temp(Src) 98.6 F (37 C) (Oral)  Ht 5\' 5"  (1.651 m)  Wt 201 lb (91.173 kg)  BMI 33.45 kg/m2  SpO2 98%  LMP 07/31/2013  General appearance: alert, cooperative and appears stated age Head: Normocephalic, without obvious abnormality, atraumatic Eyes: conjunctivae/corneas clear. PERRL, EOM's intact. Fundi benign. Ears: normal TM's and external ear canals both ears Nose: Nares normal. Septum midline. Mucosa normal.  No drainage or sinus tenderness. Throat: lips, mucosa, and tongue normal; teeth and gums normal Neck: no adenopathy, no carotid bruit, no JVD, supple, symmetrical, trachea midline and thyroid not enlarged, symmetric, no tenderness/mass/nodules Lungs: clear to auscultation bilaterally Breasts: normal appearance, no masses or tenderness Heart: regular rate and rhythm, S1, S2 normal, no murmur, click, rub or gallop Abdomen: soft, non-tender; bowel sounds normal; no masses,  no organomegaly Extremities: extremities normal, atraumatic, no cyanosis or edema Pulses: 2+ and symmetric Skin: Skin color, texture, turgor normal. No rashes or lesions Neurologic: Alert and oriented X 3, normal strength and tone. Normal symmetric reflexes. Normal coordination and gait.    Assessment and Plan:   Obesity (BMI 30-39.9) She gained 40 lbs last uear but has been steadily losing weight through diet and exercise. Reviewed her efforts and discussed adding phentermine if needed.   Generalized anxiety disorder Improved with citalopram 20 mg daily and continue when necessary use of alprazolam not more than once daily.    Encounter for preventive health examination Annual comprehensive exam was done including breast, excluding pelvic and PAP smear. All screenings have been addressed .    Updated Medication List Outpatient Encounter Prescriptions as of 08/07/2013  Medication Sig  . omeprazole (PRILOSEC) 40 MG capsule TAKE 1 CAPSULE BY MOUTH EVERY DAY  . SUMAtriptan (IMITREX) 50  MG tablet 1 tablet as needed at onset of headache. Max 2 daily  . ALPRAZolam (XANAX) 0.5 MG tablet Take 1 tablet (0.5 mg total) by mouth at bedtime as needed for anxiety.  Marland Kitchen. amitriptyline (ELAVIL) 25 MG tablet Take 1 tablet (25 mg total) by mouth at bedtime. Increase weekly by 25 mg to maxium dose 100 mg  . [DISCONTINUED] ALPRAZolam (XANAX) 0.5 MG tablet Take 1 tablet (0.5 mg total) by mouth at bedtime as needed for anxiety.  .  [DISCONTINUED] ALPRAZolam (XANAX) 0.5 MG tablet Take 0.5 mg by mouth at bedtime as needed for anxiety.  . [DISCONTINUED] ALPRAZolam (XANAX) 0.5 MG tablet TAKE 1 TABLET BY MOUTH AT BEDTIME AS NEEDED FOR ANXIETY  . [DISCONTINUED] amitriptyline (ELAVIL) 25 MG tablet Take 1 tablet (25 mg total) by mouth at bedtime. Increase weekly by 25 mg to maxium dose 100 mg  . [DISCONTINUED] citalopram (CELEXA) 20 MG tablet Take 1 tablet (20 mg total) by mouth daily.

## 2013-08-07 NOTE — Patient Instructions (Signed)
You are doing well!! You do not need any labs done until November   We discussed resuming amitripytline at bedtime to decrease your headache occurrences

## 2013-08-09 DIAGNOSIS — Z0000 Encounter for general adult medical examination without abnormal findings: Secondary | ICD-10-CM | POA: Insufficient documentation

## 2013-08-09 NOTE — Assessment & Plan Note (Signed)
Annual comprehensive exam was done including breast, excluding pelvic and PAP smear. All screenings have been addressed .  

## 2013-08-09 NOTE — Assessment & Plan Note (Signed)
Improved with citalopram 20 mg daily and continue when necessary use of alprazolam not more than once daily.

## 2013-08-09 NOTE — Assessment & Plan Note (Signed)
She gained 40 lbs last uear but has been steadily losing weight through diet and exercise. Reviewed her efforts and discussed adding phentermine if needed.

## 2013-08-18 ENCOUNTER — Encounter: Payer: Self-pay | Admitting: Internal Medicine

## 2013-08-26 ENCOUNTER — Telehealth: Payer: Self-pay | Admitting: Internal Medicine

## 2013-08-26 DIAGNOSIS — E669 Obesity, unspecified: Secondary | ICD-10-CM

## 2013-08-26 MED ORDER — PHENTERMINE HCL 37.5 MG PO TABS: ORAL_TABLET | ORAL | Status: DC

## 2013-08-26 NOTE — Telephone Encounter (Signed)
Left message, notifying pt and to call back to schedule nurse visit

## 2013-08-26 NOTE — Assessment & Plan Note (Addendum)
She has had difficulty losing weight due to increased appetite and is requesting a trial of  Phentermine.  She is aware of the possible side effects and risks and understands that    The medication will be discontinued if she has not lost 5% of her body weight over the next 3 months, which , based on today's weight is 10 lbs. 

## 2013-08-26 NOTE — Telephone Encounter (Signed)
Phentermine rx printed.  One month only,  Must return for RN visit for VS prior to additional refills

## 2013-08-26 NOTE — Telephone Encounter (Signed)
[phone] Pt called checking to see if this rx has been called in  Please call pt @ [phone] When rx has been called in

## 2013-08-26 NOTE — Telephone Encounter (Signed)
The patient has continued to try to lose weight ,but she has reach a plateau in her weight loss and she is needing a weight loss pill to continue to lose the weight . She stated that it was discussed at he last office visit.

## 2013-08-27 ENCOUNTER — Telehealth: Payer: Self-pay | Admitting: Internal Medicine

## 2013-08-27 NOTE — Telephone Encounter (Signed)
Pt states [facility] did not receive script for phentermine.  Please resend.

## 2013-08-27 NOTE — Telephone Encounter (Signed)
Nurse visit scheduled resent RX.

## 2013-08-29 ENCOUNTER — Other Ambulatory Visit: Payer: Self-pay | Admitting: Internal Medicine

## 2013-09-03 ENCOUNTER — Telehealth: Payer: Self-pay | Admitting: Internal Medicine

## 2013-09-03 NOTE — Telephone Encounter (Signed)
The patient was given a prescription for her migraine medicine at her last visit not her Xanax per the patient . She is needing her prescription for her Xanax.

## 2013-09-03 NOTE — Telephone Encounter (Signed)
Called script to pharmacy as requested. 

## 2013-09-09 ENCOUNTER — Telehealth: Payer: Self-pay | Admitting: *Deleted

## 2013-09-09 ENCOUNTER — Ambulatory Visit (INDEPENDENT_AMBULATORY_CARE_PROVIDER_SITE_OTHER): Payer: BC Managed Care – PPO | Admitting: *Deleted

## 2013-09-09 VITALS — BP 106/68 | HR 77

## 2013-09-09 DIAGNOSIS — E669 Obesity, unspecified: Secondary | ICD-10-CM

## 2013-09-09 NOTE — Telephone Encounter (Signed)
Pt advised.

## 2013-09-09 NOTE — Progress Notes (Signed)
Pt presents for VS, taking Phentermine 1/2 tab bid, tolerating without difficulty. BP 106/68, HR 77.

## 2013-09-09 NOTE — Telephone Encounter (Signed)
Pt presents for VS, taking Phentermine 1/2 tab bid, tolerating without difficulty. BP 106/68, HR 77.  

## 2013-09-09 NOTE — Telephone Encounter (Signed)
Ok to continue

## 2013-10-03 ENCOUNTER — Other Ambulatory Visit: Payer: Self-pay | Admitting: Internal Medicine

## 2013-10-06 NOTE — Telephone Encounter (Signed)
Refill one 30 days only.  Will not refill again without office visit

## 2013-10-06 NOTE — Telephone Encounter (Signed)
Last refill 5.27.15, last OV 5.7.15, no future OV.  Please advise refill.

## 2013-10-06 NOTE — Telephone Encounter (Signed)
Refill faxed

## 2013-10-07 ENCOUNTER — Encounter: Payer: Self-pay | Admitting: Internal Medicine

## 2013-10-07 NOTE — Telephone Encounter (Signed)
[facility]  Advised pt rx sent per previous msg.  Advised pt needs OV.  Scheduled.

## 2013-10-24 ENCOUNTER — Ambulatory Visit: Payer: BC Managed Care – PPO

## 2013-11-06 ENCOUNTER — Ambulatory Visit: Payer: BC Managed Care – PPO | Admitting: Internal Medicine

## 2013-12-04 ENCOUNTER — Encounter: Payer: Self-pay | Admitting: Family Medicine

## 2013-12-04 ENCOUNTER — Encounter: Payer: Self-pay | Admitting: *Deleted

## 2013-12-04 ENCOUNTER — Ambulatory Visit (INDEPENDENT_AMBULATORY_CARE_PROVIDER_SITE_OTHER): Payer: BC Managed Care – PPO | Admitting: Family Medicine

## 2013-12-04 ENCOUNTER — Ambulatory Visit: Payer: BC Managed Care – PPO | Admitting: Adult Health

## 2013-12-04 VITALS — BP 110/70 | HR 68 | Temp 98.1°F | Wt 193.5 lb

## 2013-12-04 DIAGNOSIS — L089 Local infection of the skin and subcutaneous tissue, unspecified: Secondary | ICD-10-CM

## 2013-12-04 NOTE — Progress Notes (Signed)
   BP 110/70  Pulse 68  Temp(Src) 98.1 F (36.7 C) (Oral)  Wt 193 lb 8 oz (87.771 kg)   CC: red skin on ankle  Subjective:    Patient ID: Jamie Weber, female    DOB: Aug 15, 1985, 28 y.o.   MRN: [id number]  HPI: Jamie Weber is a 28 y.o. female presenting on 12/04/2013 for Cellulitis   New shoes 11/16/2013 - blistered right heel - self treated with peroxide, neopsorin and bandaid. Seemed like it fully healed.  Sunday noted erythematous knot in back of ankle, Monday painful to walk on foot, since then more painful, more swelling and some spreading of redness up posterior leg. HA on Sunday and Monday. Nauseated but no vomiting. No draining.  Has been wrapping foot which worsens pain. Tried ibuprofen last night.  Denies fevers, chills.  H/o migraines.  No antibiotics recently. No personal h/o MRSA but both daughters had had MRSA. Daughter currently on abx for MRSA infection  Relevant past medical, surgical, family and social history reviewed and updated as indicated.  Allergies and medications reviewed and updated. Current Outpatient Prescriptions on File Prior to Visit  Medication Sig  . ALPRAZolam (XANAX) 0.5 MG tablet Take 1 tablet (0.5 mg total) by mouth at bedtime as needed for anxiety.  Marland Kitchen omeprazole (PRILOSEC) 40 MG capsule TAKE 1 CAPSULE BY MOUTH EVERY DAY  . SUMAtriptan (IMITREX) 50 MG tablet 1 tablet as needed at onset of headache. Max 2 daily  . amitriptyline (ELAVIL) 25 MG tablet Take 1 tablet (25 mg total) by mouth at bedtime. Increase weekly by 25 mg to maxium dose 100 mg  . phentermine (ADIPEX-P) 37.5 MG tablet TAKE 1/2 TABLET BY MOUTH EVERY MORNING AND EARLY AFTERNOON FOR APPETITE SUPPRESSION.   No current facility-administered medications on file prior to visit.    Review of Systems Per HPI unless specifically indicated above    Objective:    BP 110/70  Pulse 68  Temp(Src) 98.1 F (36.7 C) (Oral)  Wt 193 lb 8 oz (87.771 kg)  Physical Exam  Nursing  note and vitals reviewed. Constitutional: She appears well-developed and well-nourished. No distress.  Musculoskeletal: She exhibits edema.  2+ DP bilaterally Swollen very tender erythematous area at posterior heel on right - at retrocalcaneal bursa. Dorsi/plantar flexion preserved.       Assessment & Plan:   Problem List Items Addressed This Visit   Foot infection - Primary     I have difficulty distinguishing between septic retrocalcaneal bursitis and just cellulitis. I would appreciate ortho input today - will place urgent referral to orthopedics. Discussed this with patient.    Relevant Orders      Ambulatory referral to Orthopedic Surgery       Follow up plan: Return if symptoms worsen or fail to improve.

## 2013-12-04 NOTE — Progress Notes (Signed)
Pre visit review using our clinic review tool, if applicable. No additional management support is needed unless otherwise documented below in the visit note. 

## 2013-12-04 NOTE — Assessment & Plan Note (Addendum)
I have difficulty distinguishing between septic retrocalcaneal bursitis and just cellulitis. I would appreciate ortho input today - will place urgent referral to orthopedics. Discussed this with patient.

## 2013-12-04 NOTE — Patient Instructions (Signed)
I'd like you to see orthopedist today to help rule out infected bursitis of that heel. Pass by our front office for referral today.

## 2014-02-11 ENCOUNTER — Telehealth: Payer: Self-pay | Admitting: *Deleted

## 2014-02-11 DIAGNOSIS — R5383 Other fatigue: Secondary | ICD-10-CM

## 2014-02-11 DIAGNOSIS — E669 Obesity, unspecified: Secondary | ICD-10-CM

## 2014-02-11 NOTE — Telephone Encounter (Signed)
Pt is coming tomorrow what labs and dx?  

## 2014-02-12 ENCOUNTER — Other Ambulatory Visit: Payer: BC Managed Care – PPO

## 2014-07-21 NOTE — Op Note (Signed)
PATIENT NAME:  Jamie Weber, Jamie Weber MR#:  [id number] DATE OF BIRTH:  1985-07-03  DATE OF PROCEDURE:  01/24/2012  PREOPERATIVE DIAGNOSES:  1. Fetal intolerance to labor. 2. Secondary arrest of descent.   POSTOPERATIVE DIAGNOSES:  1. Fetal intolerance to labor. 2. Secondary arrest of descent.   PROCEDURE PERFORMED: Low transverse Cesarean section.   ANESTHESIA: Spinal.   SURGEON: Jamie Faulcon A. Patton Salles, MD   ASSISTANT: Jamie Weber, CNM   ESTIMATED BLOOD LOSS: 800 mL.  OPERATIVE FLUIDS: 1 liter.   COMPLICATIONS: None.   FINDINGS: Vertex female infant, occiput posterior, 3710 grams, Apgars 8 and 9, normal uterus, tubes, and ovaries.   SPECIMEN: None.   INDICATIONS: The patient is a 29 year old who presented one day ago with spontaneous rupture of membranes. The patient eventually had Pitocin augmentation and progressed to complete, however, had no further descent of the fetal head with pushing, therefore, with fetal head at +2 station, flat Kiwi vacuum delivery was attempted with three pop-offs. Decision was made to proceed towards Cesarean section for delivery. Risks, benefits, indications, and alternatives of the procedure were explained and informed consent was obtained.   PROCEDURE: The patient was taken to the operating room with IV fluids running. She was prepped and draped in the usual sterile fashion in a leftward tilt after spinal anesthesia was placed and found to be adequate. A Pfannenstiel skin incision was made and carried down to underlying fascia with the knife. The fascia was nicked in the midline. The incision was extended laterally. The Kocher clamps were placed on the superior aspect of the rectus muscle and the underlying muscles were dissected off using curved Mayo scissors. This was repeated on the inferior fascia. The peritoneum was entered bluntly. The opening was extended. A bladder blade was placed. The vesicouterine peritoneum was identified and grasped with pick-ups  and entered sharply with the Metzenbaum scissors. The bladder flap was created digitally. The bladder blade was then replaced. A hysterotomy incision was made and carried down to underlying fetal membranes which were ruptured. The opening was extended. The infant's head was grasped, however, due to significant caput RN put on a sterile glove and pushed on the fetal head. From below the infant's head was eventually grasped and was delivered atraumatically through the hysterotomy incision after attempting use of vacuum. Suction was not sufficient, therefore, vacuum was abandoned and with fundal pressure the infant's head eventually delivered. The infant was found to be in the occiput posterior position. The cord was clamped x2 and cut. The infant was handed to the awaiting nursery team. The placenta was expressed. The uterus was exteriorized and cleared of all clot and debris. The hysterotomy incision was repaired with a #0 Monocryl in a running locked fashion. The uterus was returned to the abdomen. The abdomen and gutters were irrigated with copious amounts of warm normal saline. The peritoneum was repaired with a #2-0 Vicryl. The On-Q pump apparatus was placed according to manufacturer's instructions. The fascia was closed with #1 PDS. The skin was closed with staples. The openings through which the On-Q catheters emanated from were closed with Dermabond skin glue. The two catheters were each bolused with 0.5% Sensorcaine 5 mL. The catheters were secured to the patient's abdomen using Steri-Strips and Tegaderm.   The patient tolerated the procedure well. Sponge, needle, and instrument counts were correct x2. The patient was taken to the recovery room in stable condition.   ____________________________ Jamie Primes Patton Salles, MD law:drc D: 01/24/2012 23:11:26 ET T: 01/25/2012 09:36:34 ET  JOB#: [id number]  cc: Jamie Zaldivar A. Patton SallesWeaver-Lee, MD, <Dictator> Jamie PrimesLASHAWN A WEAVER LEE MD ELECTRONICALLY SIGNED 02/02/2012  12:23

## 2014-08-11 NOTE — H&P (Signed)
L&D Evaluation:  History:   HPI 29 yo G3P0111 at 7561w0d gestational age with history of PPROM and polyhydramnios with preterm delivery at 7633 weeks presents with abdominal cramps.  These started at 130 this morning.  She notes no vaginal bleeding, no leakage of fluid and good positive fetal movement.  She denies vaginal symptoms of discharge, irritation, and itching. She has not had intercourse in the past 24 hours.  Her pregnancy is also complicated by obesity (BMI 31), Rh negative status. For her history of preterm birth, she is receiving 17 OHPC injections.  A-, RI, VZI, HBsAg neg, early glucola 126, GBS unk    Patient's Medical History migraines, obesity, GERD    Patient's Surgical History D&C  diagnostic laparoscopy    Medications fioricet, PNV    Allergies NKDA    Social History tobacco    Family History Non-Contributory   ROS:   ROS All systems were reviewed.  HEENT, CNS, GI, GU, Respiratory, CV, Renal and Musculoskeletal systems were found to be normal., unless noted in HPI   Exam:   Vital Signs stable    Urine Protein negative dipstick    General no apparent distress    Mental Status clear    Chest clear    Heart normal sinus rhythm    Abdomen gravid, non-tender    Back no CVAT    Edema no edema    Pelvic no external lesions, cervix closed and thick    Mebranes Intact    FHT normal for gestational age    Ucx absent    Skin no lesions    Lymph no lymphadenopathy    Other UA: neg LE, neg Nit.  epis 1, bact neg [place]; Negative   Impression:   Impression abdominal pain   Plan:   Plan UA, fetal fibronectin    Comments keep appt tomorrow   Electronic Signatures: Conard NovakJackson, Islam Eichinger D (MD)  (Signed 04-Jul-13 08:00)  Authored: L&D Evaluation   Last Updated: 04-Jul-13 08:00 by Conard NovakJackson, Payeton Germani D (MD)

## 2014-08-11 NOTE — H&P (Signed)
L&D Evaluation:  History Expanded:   HPI 29 yo G3 P0111 with EDD of 02/01/12 per early US. Presents with c/o nausea & vomiting, abdominal pain and leaking of fluid x 1 today. [facility] notable for receiving weekly 17P injections for h/o preterm delivery at 32 weeks, RH negative (received rhogam at 28 weeks).    Blood Type (Maternal) A negative    Group B Strep Results Maternal (Result >5wks must be treated as unknown) unknown/result > 5 weeks ago    Maternal HIV Negative    Maternal Syphilis Ab Nonreactive    Maternal Varicella Immune    Rubella Results (Maternal) immune    Maternal T-Dap Immune    Patient's Surgical History D&C  diagnostic laparoscopy, hysteroscopy    Medications Pre Natal Vitamins    Allergies NKDA    Social History tobacco   ROS:   ROS see HPI   Exam:   Vital Signs stable    General no apparent distress    Mental Status clear    Abdomen gravid, tender with contractions    Pelvic no external lesions, FT/long/0    Mebranes Intact, nitrizine negative    FHT normal rate with no decels    Ucx regular    Other Urine dark amber   Impression:   Impression IUP at 35 3/7 wks with c/o N/V, abd pain, leaking fluid   Plan:   Plan monitor contractions and for cervical change    Comments Zofran po, po hydration   Electronic Signatures: Butler Vegh, Delaney Meigsamara K (CNM)  (Signed 29-Sep-13 20:10)  Authored: L&D Evaluation   Last Updated: 29-Sep-13 20:10 by Vella KohlerBrothers, Ismael Karge K (CNM)

## 2014-08-11 NOTE — H&P (Signed)
L&D Evaluation:  History Expanded:   HPI 29 yo G3P0111 at 3138 w 6 d who was on Progesterone for PPROM at 32 weeks with her kast opregnancy, she had rhogam 11/10/11 and she has had an uneventful pregnancy otherwise. she p[resents with SROM clear fluid and GBS pos with strictly reacgtive fetal tgracing and AF. will admit and start antibiotics and anticipate SVD.    Gravida 3    Term 0    PreTerm 1    Abortion 1    Living 1    Blood Type (Maternal) A negative    Group B Strep Results Maternal (Result >5wks must be treated as unknown) positive    Maternal HIV Negative    Maternal Syphilis Ab Nonreactive    Maternal Varicella Immune    Rubella Results (Maternal) immune    Maternal T-Dap Immune    EDC 01-Feb-2012    Presents with contractions, SROM    Patient's Medical History No Chronic Illness    Patient's Surgical History none    Medications Pre Natal Vitamins    Allergies NKDA    Social History none    Family History Non-Contributory   ROS:   ROS All systems were reviewed.  HEENT, CNS, GI, GU, Respiratory, CV, Renal and Musculoskeletal systems were found to be normal.   Exam:   Vital Signs stable    Urine Protein not completed    General no apparent distress    Mental Status clear    Chest clear    Heart normal sinus rhythm    Abdomen gravid, non-tender    Estimated Fetal Weight Small for gestational age    Fetal Position v    Fundal Height term    Back no CVAT    Edema no edema    Reflexes 1+    Description clear    FHT normal rate with no decels    Ucx irregular    Skin dry    Lymph no lymphadenopathy   Impression:   Impression early labor   Plan:   Plan EFM/NST, monitor contractions and for cervical change, antibiotics for GBBS prophylaxis    Follow Up Appointment need to schedule. in 6 weeks   Electronic Signatures: Adria DevonKlett, Karla Vines (MD)  (Signed 23-Oct-13 05:35)  Authored: L&D Evaluation   Last Updated: 23-Oct-13 05:35  by Adria DevonKlett, Halli Equihua (MD)

## 2014-10-21 ENCOUNTER — Telehealth: Payer: Self-pay | Admitting: *Deleted

## 2014-10-21 MED ORDER — OMEPRAZOLE 40 MG PO CPDR: DELAYED_RELEASE_CAPSULE | ORAL | Status: DC

## 2014-10-21 NOTE — Telephone Encounter (Signed)
Pt called requesting Prilosec refill.  Last OV 6.3.15.  Pt has appoint scheduled for 8.3.16.  Please advise refill

## 2014-11-04 ENCOUNTER — Ambulatory Visit (INDEPENDENT_AMBULATORY_CARE_PROVIDER_SITE_OTHER): Payer: BLUE CROSS/BLUE SHIELD | Admitting: Internal Medicine

## 2014-11-04 ENCOUNTER — Other Ambulatory Visit (HOSPITAL_COMMUNITY)
Admission: RE | Admit: 2014-11-04 | Discharge: 2014-11-04 | Disposition: A | Discharge status: Home / Self Care | Payer: BLUE CROSS/BLUE SHIELD | Source: Ambulatory Visit | Attending: Internal Medicine | Admitting: Internal Medicine

## 2014-11-04 ENCOUNTER — Encounter: Payer: Self-pay | Admitting: Internal Medicine

## 2014-11-04 VITALS — BP 110/62 | HR 107 | Temp 98.1°F | Ht 65.0 in | Wt 196.0 lb

## 2014-11-04 DIAGNOSIS — Z124 Encounter for screening for malignant neoplasm of cervix: Secondary | ICD-10-CM | POA: Diagnosis not present

## 2014-11-04 DIAGNOSIS — Z1151 Encounter for screening for human papillomavirus (HPV): Secondary | ICD-10-CM | POA: Insufficient documentation

## 2014-11-04 DIAGNOSIS — Z01419 Encounter for gynecological examination (general) (routine) without abnormal findings: Secondary | ICD-10-CM | POA: Insufficient documentation

## 2014-11-04 DIAGNOSIS — R5383 Other fatigue: Secondary | ICD-10-CM | POA: Diagnosis not present

## 2014-11-04 DIAGNOSIS — F411 Generalized anxiety disorder: Secondary | ICD-10-CM

## 2014-11-04 DIAGNOSIS — Z0000 Encounter for general adult medical examination without abnormal findings: Secondary | ICD-10-CM

## 2014-11-04 DIAGNOSIS — Z8669 Personal history of other diseases of the nervous system and sense organs: Secondary | ICD-10-CM

## 2014-11-04 DIAGNOSIS — L309 Dermatitis, unspecified: Secondary | ICD-10-CM | POA: Diagnosis not present

## 2014-11-04 DIAGNOSIS — E669 Obesity, unspecified: Secondary | ICD-10-CM

## 2014-11-04 MED ORDER — SUMATRIPTAN SUCCINATE 50 MG PO TABS: ORAL_TABLET | ORAL | Status: DC

## 2014-11-04 MED ORDER — ALPRAZOLAM 0.5 MG PO TABS: 0.5000 mg | ORAL_TABLET | Freq: Every evening | ORAL | Status: DC | PRN

## 2014-11-04 MED ORDER — PREDNISONE 10 MG PO TABS: ORAL_TABLET | ORAL | Status: DC

## 2014-11-04 MED ORDER — MINOCYCLINE HCL 100 MG PO CAPS: 100.0000 mg | ORAL_CAPSULE | Freq: Two times a day (BID) | ORAL | Status: DC

## 2014-11-04 NOTE — Patient Instructions (Addendum)
I am treating you for contact dermatitis complicated by bacterial infection   Minocycline is an antibiotic Prednisone is for inflammation  Take both for one week   Please take a probiotic ( Align, Floraque or Culturelle) or the generic version of one of these  For a minimum of 3 weeks to prevent a serious antibiotic associated diarrhea  Called clostridium dificile colitis    Health Maintenance Adopting a healthy lifestyle and getting preventive care can go a long way to promote health and wellness. Talk with your health care provider about what schedule of regular examinations is right for you. This is a good chance for you to check in with your provider about disease prevention and staying healthy. In between checkups, there are plenty of things you can do on your own. Experts have done a lot of research about which lifestyle changes and preventive measures are most likely to keep you healthy. Ask your health care provider for more information. WEIGHT AND DIET  Eat a healthy diet  Be sure to include plenty of vegetables, fruits, low-fat dairy products, and lean protein.  Do not eat a lot of foods high in solid fats, added sugars, or salt.  Get regular exercise. This is one of the most important things you can do for your health.  Most adults should exercise for at least 150 minutes each week. The exercise should increase your heart rate and make you sweat (moderate-intensity exercise).  Most adults should also do strengthening exercises at least twice a week. This is in addition to the moderate-intensity exercise.  Maintain a healthy weight  Body mass index (BMI) is a measurement that can be used to identify possible weight problems. It estimates body fat based on height and weight. Your health care provider can help determine your BMI and help you achieve or maintain a healthy weight.  For females 13 years of age and older:   A BMI below 18.5 is considered underweight.  A BMI of  18.5 to 24.9 is normal.  A BMI of 25 to 29.9 is considered overweight.  A BMI of 30 and above is considered obese.  Watch levels of cholesterol and blood lipids  You should start having your blood tested for lipids and cholesterol at 27 years of age, then have this test every 5 years.  You may need to have your cholesterol levels checked more often if:  Your lipid or cholesterol levels are high.  You are older than 27 years of age.  You are at high risk for heart disease.  CANCER SCREENING   Lung Cancer  Lung cancer screening is recommended for adults 83-44 years old who are at high risk for lung cancer because of a history of smoking.  A yearly low-dose CT scan of the lungs is recommended for people who:  Currently smoke.  Have quit within the past 15 years.  Have at least a 30-pack-year history of smoking. A pack year is smoking an average of one pack of cigarettes a day for 1 year.  Yearly screening should continue until it has been 15 years since you quit.  Yearly screening should stop if you develop a health problem that would prevent you from having lung cancer treatment.  Breast Cancer  Practice breast self-awareness. This means understanding how your breasts normally appear and feel.  It also means doing regular breast self-exams. Let your health care provider know about any changes, no matter how small.  If you are in your 77s or  54s, you should have a clinical breast exam (CBE) by a health care provider every 1-3 years as part of a regular health exam.  If you are 42 or older, have a CBE every year. Also consider having a breast X-ray (mammogram) every year.  If you have a family history of breast cancer, talk to your health care provider about genetic screening.  If you are at high risk for breast cancer, talk to your health care provider about having an MRI and a mammogram every year.  Breast cancer gene (BRCA) assessment is recommended for women who  have family members with BRCA-related cancers. BRCA-related cancers include:  Breast.  Ovarian.  Tubal.  Peritoneal cancers.  Results of the assessment will determine the need for genetic counseling and BRCA1 and BRCA2 testing. Cervical Cancer Routine pelvic examinations to screen for cervical cancer are no longer recommended for nonpregnant women who are considered low risk for cancer of the pelvic organs (ovaries, uterus, and vagina) and who do not have symptoms. A pelvic examination may be necessary if you have symptoms including those associated with pelvic infections. Ask your health care provider if a screening pelvic exam is right for you.   The Pap test is the screening test for cervical cancer for women who are considered at risk.  If you had a hysterectomy for a problem that was not cancer or a condition that could lead to cancer, then you no longer need Pap tests.  If you are older than 65 years, and you have had normal Pap tests for the past 10 years, you no longer need to have Pap tests.  If you have had past treatment for cervical cancer or a condition that could lead to cancer, you need Pap tests and screening for cancer for at least 20 years after your treatment.  If you no longer get a Pap test, assess your risk factors if they change (such as having a new sexual partner). This can affect whether you should start being screened again.  Some women have medical problems that increase their chance of getting cervical cancer. If this is the case for you, your health care provider may recommend more frequent screening and Pap tests.  The human papillomavirus (HPV) test is another test that may be used for cervical cancer screening. The HPV test looks for the virus that can cause cell changes in the cervix. The cells collected during the Pap test can be tested for HPV.  The HPV test can be used to screen women 97 years of age and older. Getting tested for HPV can extend the  interval between normal Pap tests from three to five years.  An HPV test also should be used to screen women of any age who have unclear Pap test results.  After 27 years of age, women should have HPV testing as often as Pap tests.  Colorectal Cancer  This type of cancer can be detected and often prevented.  Routine colorectal cancer screening usually begins at 27 years of age and continues through 27 years of age.  Your health care provider may recommend screening at an earlier age if you have risk factors for colon cancer.  Your health care provider may also recommend using home test kits to check for hidden blood in the stool.  A small camera at the end of a tube can be used to examine your colon directly (sigmoidoscopy or colonoscopy). This is done to check for the earliest forms of colorectal cancer.  Routine screening usually begins at age 70.  Direct examination of the colon should be repeated every 5-10 years through 27 years of age. However, you may need to be screened more often if early forms of precancerous polyps or small growths are found. Skin Cancer  Check your skin from head to toe regularly.  Tell your health care provider about any new moles or changes in moles, especially if there is a change in a mole's shape or color.  Also tell your health care provider if you have a mole that is larger than the size of a pencil eraser.  Always use sunscreen. Apply sunscreen liberally and repeatedly throughout the day.  Protect yourself by wearing long sleeves, pants, a wide-brimmed hat, and sunglasses whenever you are outside. HEART DISEASE, DIABETES, AND HIGH BLOOD PRESSURE   Have your blood pressure checked at least every 1-2 years. High blood pressure causes heart disease and increases the risk of stroke.  If you are between 80 years and 2 years old, ask your health care provider if you should take aspirin to prevent strokes.  Have regular diabetes screenings. This  involves taking a blood sample to check your fasting blood sugar level.  If you are at a normal weight and have a low risk for diabetes, have this test once every three years after 27 years of age.  If you are overweight and have a high risk for diabetes, consider being tested at a younger age or more often. PREVENTING INFECTION  Hepatitis B  If you have a higher risk for hepatitis B, you should be screened for this virus. You are considered at high risk for hepatitis B if:  You were born in a country where hepatitis B is common. Ask your health care provider which countries are considered high risk.  Your parents were born in a high-risk country, and you have not been immunized against hepatitis B (hepatitis B vaccine).  You have HIV or AIDS.  You use needles to inject street drugs.  You live with someone who has hepatitis B.  You have had sex with someone who has hepatitis B.  You get hemodialysis treatment.  You take certain medicines for conditions, including cancer, organ transplantation, and autoimmune conditions. Hepatitis C  Blood testing is recommended for:  Everyone born from [birth year] through 1963.  Anyone with known risk factors for hepatitis C. Sexually transmitted infections (STIs)  You should be screened for sexually transmitted infections (STIs) including gonorrhea and chlamydia if:  You are sexually active and are younger than 27 years of age.  You are older than 27 years of age and your health care provider tells you that you are at risk for this type of infection.  Your sexual activity has changed since you were last screened and you are at an increased risk for chlamydia or gonorrhea. Ask your health care provider if you are at risk.  If you do not have HIV, but are at risk, it may be recommended that you take a prescription medicine daily to prevent HIV infection. This is called pre-exposure prophylaxis (PrEP). You are considered at risk if:  You are  sexually active and do not regularly use condoms or know the HIV status of your partner(s).  You take drugs by injection.  You are sexually active with a partner who has HIV. Talk with your health care provider about whether you are at high risk of being infected with HIV. If you choose to begin PrEP, you should first  be tested for HIV. You should then be tested every 3 months for as long as you are taking PrEP.  PREGNANCY   If you are premenopausal and you may become pregnant, ask your health care provider about preconception counseling.  If you may become pregnant, take 400 to 800 micrograms (mcg) of folic acid every day.  If you want to prevent pregnancy, talk to your health care provider about birth control (contraception). OSTEOPOROSIS AND MENOPAUSE   Osteoporosis is a disease in which the bones lose minerals and strength with aging. This can result in serious bone fractures. Your risk for osteoporosis can be identified using a bone density scan.  If you are 65 years of age or older, or if you are at risk for osteoporosis and fractures, ask your health care provider if you should be screened.  Ask your health care provider whether you should take a calcium or vitamin D supplement to lower your risk for osteoporosis.  Menopause may have certain physical symptoms and risks.  Hormone replacement therapy may reduce some of these symptoms and risks. Talk to your health care provider about whether hormone replacement therapy is right for you.  HOME CARE INSTRUCTIONS   Schedule regular health, dental, and eye exams.  Stay current with your immunizations.   Do not use any tobacco products including cigarettes, chewing tobacco, or electronic cigarettes.  If you are pregnant, do not drink alcohol.  If you are breastfeeding, limit how much and how often you drink alcohol.  Limit alcohol intake to no more than 1 drink per day for nonpregnant women. One drink equals 12 ounces of beer, 5  ounces of wine, or 1 ounces of hard liquor.  Do not use street drugs.  Do not share needles.  Ask your health care provider for help if you need support or information about quitting drugs.  Tell your health care provider if you often feel depressed.  Tell your health care provider if you have ever been abused or do not feel safe at home. Document Released: 10/03/2010 Document Revised: 08/04/2013 Document Reviewed: 02/19/2013 [facility] Patient Information 2015 [place], [place]. This information is not intended to replace advice given to you by your health care provider. Make sure you discuss any questions you have with your health care provider. prev

## 2014-11-04 NOTE — Progress Notes (Signed)
Pre visit review using our clinic review tool, if applicable. No additional management support is needed unless otherwise documented below in the visit note. 

## 2014-11-05 LAB — CBC WITH DIFFERENTIAL/PLATELET
Basophils Absolute: 0.1 10*3/uL (ref 0.0–0.1)
Basophils Relative: 0.8 % (ref 0.0–3.0)
EOS ABS: 0.1 10*3/uL (ref 0.0–0.7)
Eosinophils Relative: 1.4 % (ref 0.0–5.0)
HEMATOCRIT: 39.8 % (ref 36.0–46.0)
Hemoglobin: 13.4 g/dL (ref 12.0–15.0)
LYMPHS ABS: 1.9 10*3/uL (ref 0.7–4.0)
LYMPHS PCT: 22 % (ref 12.0–46.0)
MCHC: 33.5 g/dL (ref 30.0–36.0)
MCV: 90.4 fl (ref 78.0–100.0)
MONO ABS: 0.6 10*3/uL (ref 0.1–1.0)
Monocytes Relative: 6.9 % (ref 3.0–12.0)
NEUTROS ABS: 5.9 10*3/uL (ref 1.4–7.7)
NEUTROS PCT: 68.9 % (ref 43.0–77.0)
Platelets: 256 10*3/uL (ref 150.0–400.0)
RBC: 4.4 Mil/uL (ref 3.87–5.11)
RDW: 13.1 % (ref 11.5–15.5)
WBC: 8.6 10*3/uL (ref 4.0–10.5)

## 2014-11-05 LAB — COMPREHENSIVE METABOLIC PANEL
ALT: 16 U/L (ref 0–35)
AST: 13 U/L (ref 0–37)
Albumin: 4.3 g/dL (ref 3.5–5.2)
Alkaline Phosphatase: 40 U/L (ref 39–117)
BUN: 9 mg/dL (ref 6–23)
CO2: 28 mEq/L (ref 19–32)
CREATININE: 0.63 mg/dL (ref 0.40–1.20)
Calcium: 9.4 mg/dL (ref 8.4–10.5)
Chloride: 104 mEq/L (ref 96–112)
GFR: 120.38 mL/min (ref >60.00)
GLUCOSE: 99 mg/dL (ref 70–99)
Potassium: 3.8 mEq/L (ref 3.5–5.1)
SODIUM: 138 meq/L (ref 135–145)
TOTAL PROTEIN: 6.7 g/dL (ref 6.0–8.3)
Total Bilirubin: 0.4 mg/dL (ref 0.2–1.2)

## 2014-11-05 LAB — LIPID PANEL
CHOL/HDL RATIO: 3
Cholesterol: 158 mg/dL (ref 0–200)
HDL: 51.7 mg/dL (ref >39.00)
LDL CALC: 88 mg/dL (ref 0–99)
NonHDL: 106.79
TRIGLYCERIDES: 93 mg/dL (ref 0.0–149.0)
VLDL: 18.6 mg/dL (ref 0.0–40.0)

## 2014-11-05 LAB — TSH: TSH: 1.66 u[IU]/mL (ref 0.35–4.50)

## 2014-11-06 LAB — CYTOLOGY - PAP

## 2014-11-06 NOTE — Assessment & Plan Note (Signed)
Improved frequency since starting qhs elavil. Continue qhs elavil and prn imitrex.Marland Kitchen

## 2014-11-06 NOTE — Assessment & Plan Note (Signed)
Persistent papular rash on face and scalp for the last 4 weeks since having contact with several animals.  minocycline and prednisone x 1 weeks

## 2014-11-06 NOTE — Assessment & Plan Note (Signed)
Her PAP smear and HPV screen were normal.  she will need a repeat PAP smear in 3 years  

## 2014-11-06 NOTE — Assessment & Plan Note (Signed)
Annual wellness  exam was done as well as a comprehensive physical exam  Including breast, pelvic and PAP smear.  .  During the course of the visit the patient was educated and counseled about appropriate screening and preventive services and screenings were brought up to date for cervical and breast cancer .  She will return for fasting labs to provide samples for diabetes screening and lipid analysis with projected  10 year  risk for CAD. nutrition counseling, skin cancer screening has been recommended, along with review of the age appropriate recommended immunizations.  Printed recommendations for health maintenance screenings was given.

## 2014-11-06 NOTE — Assessment & Plan Note (Addendum)
I have addressed  BMI and recommended wt loss of 10% of body weight over the next 6 months . Phentermine use not appropriate until she is able to adhere to a comprehensive program  using a low glycemic index diet and regular exercise a minimum of 5 days per week

## 2014-11-06 NOTE — Progress Notes (Signed)
Patient ID: Jamie Weber, female    DOB: [birth year]  Age: 29 y.o. MRN: [id number]  The patient is here for annual  wellness examination and management of other chronic and acute problem   The risk factors are reflected in the social history.  The roster of all physicians providing medical care to patient - is listed in the Snapshot section of the chart.  Activities of daily living:  The patient is 100% independent in all ADLs: dressing, toileting, feeding as well as independent mobility  Home safety : The patient has smoke detectors in the home. They wear seatbelts.  There are no firearms at home. There is no violence in the home.   There is no risks for hepatitis, STDs or HIV. There is no   history of blood transfusion. They have no travel history to infectious disease endemic areas of the world.  The patient has seen their dentist in the last six month. They have seen their eye doctor in the last year. They admit to slight hearing difficulty with regard to whispered voices and some television programs.  They have deferred audiologic testing in the last year.  They do not  have excessive sun exposure. Discussed the need for sun protection: hats, long sleeves and use of sunscreen if there is significant sun exposure.   Diet: the importance of a healthy diet is discussed. They do have a healthy diet.  The benefits of regular aerobic exercise were discussed. She walks 4 times per week ,  20 minutes.   Depression screen: there are no signs or vegative symptoms of depression- irritability, change in appetite, anhedonia, sadness/tearfullness.  Cognitive assessment: the patient manages all their financial and personal affairs and is actively engaged. They could relate day,date,year and events; recalled 2/3 objects at 3 minutes; performed clock-face test normally.  The following portions of the patient's history were reviewed and updated as appropriate: allergies, current medications, past family  history, past medical history,  past surgical history, past social history  and problem list.  Visual acuity was not assessed per patient preference since she has regular follow up with her ophthalmologist. Hearing and body mass index were assessed and reviewed.   During the course of the visit the patient was educated and counseled about appropriate screening and preventive services including : fall prevention , diabetes screening, nutrition counseling, colorectal cancer screening, and recommended immunizations.    CC: The primary encounter diagnosis was Encounter for preventive health examination. Diagnoses of Dermatitis, Screening for cervical cancer, Obesity, Other fatigue, Generalized anxiety disorder, History of migraine headaches, Pap smear for cervical cancer screening, and Obesity (BMI 30-39.9) were also pertinent to this visit.  History Sheila has a past medical history of Allergy and Depression.   She has past surgical history that includes laparoscopy (September 03, 2009) and Dilation and curettage of uterus (Jun 2011).   Her family history includes Cancer in her maternal grandmother; Heart disease in her paternal grandfather; Hyperlipidemia in her paternal grandmother; Hypothyroidism in her father.She reports that she quit smoking about 4 years ago. She has never used smokeless tobacco. She reports that she drinks alcohol. She reports that she does not use illicit drugs.  Outpatient Prescriptions Prior to Visit  Medication Sig Dispense Refill  . amitriptyline (ELAVIL) 25 MG tablet Take 1 tablet (25 mg total) by mouth at bedtime. Increase weekly by 25 mg to maxium dose 100 mg 90 tablet 3  . ALPRAZolam (XANAX) 0.5 MG tablet Take 1 tablet (0.5 mg total)  by mouth at bedtime as needed for anxiety. 30 tablet 3  . SUMAtriptan (IMITREX) 50 MG tablet 1 tablet as needed at onset of headache. Max 2 daily 15 tablet 5  . omeprazole (PRILOSEC) 40 MG capsule TAKE 1 CAPSULE BY MOUTH EVERY DAY (Patient  not taking: Reported on 11/04/2014) 90 capsule 0  . phentermine (ADIPEX-P) 37.5 MG tablet TAKE 1/2 TABLET BY MOUTH EVERY MORNING AND EARLY AFTERNOON FOR APPETITE SUPPRESSION. (Patient not taking: Reported on 11/04/2014) 30 tablet 0   No facility-administered medications prior to visit.    Review of Systems   Patient denies headache, fevers, malaise, unintentional weight loss, skin rash, eye pain, sinus congestion and sinus pain, sore throat, dysphagia,  hemoptysis , cough, dyspnea, wheezing, chest pain, palpitations, orthopnea, edema, abdominal pain, nausea, melena, diarrhea, constipation, flank pain, dysuria, hematuria, urinary  Frequency, nocturia, numbness, tingling, seizures,  Focal weakness, Loss of consciousness,  Tremor, insomnia, depression, anxiety, and suicidal ideation.      Objective:  BP 110/62 mmHg  Pulse 107  Temp(Src) 98.1 F (36.7 C) (Oral)  Ht 5\' 5"  (1.651 m)  Wt 196 lb (88.905 kg)  BMI 32.62 kg/m2  SpO2 98%  LMP 09/15/2014 (Approximate)  Physical Exam   General Appearance:    Alert, cooperative, no distress, appears stated age  Head:    Normocephalic, without obvious abnormality, atraumatic  Eyes:    PERRL, conjunctiva/corneas clear, EOM's intact, fundi    benign, both eyes  Ears:    Normal TM's and external ear canals, both ears  Nose:   Nares normal, septum midline, mucosa normal, no drainage    or sinus tenderness  Throat:   Lips, mucosa, and tongue normal; teeth and gums normal  Neck:   Supple, symmetrical, trachea midline, no adenopathy;    thyroid:  no enlargement/tenderness/nodules; no carotid   bruit or JVD  Back:     Symmetric, no curvature, ROM normal, no CVA tenderness  Lungs:     Clear to auscultation bilaterally, respirations unlabored  Chest Wall:    No tenderness or deformity   Heart:    Regular rate and rhythm, S1 and S2 normal, no murmur, rub   or gallop  Breast Exam:    No tenderness, masses, or nipple abnormality  Abdomen:     Soft,  non-tender, bowel sounds active all four quadrants,    no masses, no organomegaly  Genitalia:    Pelvic: cervix normal in appearance, external genitalia normal, no adnexal masses or tenderness, no cervical motion tenderness, rectovaginal septum normal, uterus normal size, shape, and consistency and vagina normal without discharge  Extremities:   Extremities normal, atraumatic, no cyanosis or edema  Pulses:   2+ and symmetric all extremities  Skin:   Skin color, texture, turgor normal, no rashes or lesions  Lymph nodes:   Cervical, supraclavicular, and axillary nodes normal  Neurologic:   CNII-XII intact, normal strength, sensation and reflexes    throughout      Assessment & Plan:   Problem List Items Addressed This Visit      Unprioritized   History of migraine headaches    Improved frequency since starting qhs elavil. Continue qhs elavil and prn imitrex..       Pap smear for cervical cancer screening    Her PAP smear and HPV screen were normal.  she will need a repeat PAP smear in 3 years       Obesity (BMI 30-39.9)    I have addressed  BMI and recommended  wt loss of 10% of body weight over the next 6 months . Phentermine use not appropriate until she is able to adhere to a comprehensive program  using a low glycemic index diet and regular exercise a minimum of 5 days per week       Generalized anxiety disorder    Managed without medications, except for prn alprazolam.  The risks and benefits of benzodiazepine use were discussed with patient today including excessive sedation leading to respiratory depression,  impaired thinking/driving, and addiction.  Patient was advised to avoid concurrent use with alcohol, to use medication only as needed and not to share with others  .       Encounter for preventive health examination - Primary    Annual wellness  exam was done as well as a comprehensive physical exam  Including breast, pelvic and PAP smear.  .  During the course of the  visit the patient was educated and counseled about appropriate screening and preventive services and screenings were brought up to date for cervical and breast cancer .  She will return for fasting labs to provide samples for diabetes screening and lipid analysis with projected  10 year  risk for CAD. nutrition counseling, skin cancer screening has been recommended, along with review of the age appropriate recommended immunizations.  Printed recommendations for health maintenance screenings was given.        RESOLVED: Dermatitis    Persistent papular rash on face and scalp for the last 4 weeks since having contact with several animals.  minocycline and prednisone x 1 weeks        Other Visit Diagnoses    Screening for cervical cancer        Relevant Orders    Cytology - PAP (Completed)    Obesity        Other fatigue           I have discontinued Ms. Feldhaus's phentermine and omeprazole. I am also having her start on minocycline and predniSONE. Additionally, I am having her maintain her amitriptyline, SUMAtriptan, and ALPRAZolam.  Meds ordered this encounter  Medications  . minocycline (MINOCIN,DYNACIN) 100 MG capsule    Sig: Take 1 capsule (100 mg total) by mouth 2 (two) times daily.    Dispense:  14 capsule    Refill:  0  . predniSONE (DELTASONE) 10 MG tablet    Sig: 6 tablets on Day 1 , then reduce by 1 tablet daily until gone    Dispense:  21 tablet    Refill:  0  . SUMAtriptan (IMITREX) 50 MG tablet    Sig: 1 tablet as needed at onset of headache. Max 2 daily    Dispense:  15 tablet    Refill:  5  . ALPRAZolam (XANAX) 0.5 MG tablet    Sig: Take 1 tablet (0.5 mg total) by mouth at bedtime as needed for anxiety.    Dispense:  30 tablet    Refill:  5    Medications Discontinued During This Encounter  Medication Reason  . phentermine (ADIPEX-P) 37.5 MG tablet   . omeprazole (PRILOSEC) 40 MG capsule   . SUMAtriptan (IMITREX) 50 MG tablet Reorder  . ALPRAZolam (XANAX) 0.5 MG  tablet Reorder  . omeprazole (PRILOSEC) 40 MG capsule   . phentermine (ADIPEX-P) 37.5 MG tablet     Follow-up: No Follow-up on file.   Sherlene Shams, MD

## 2014-11-06 NOTE — Progress Notes (Signed)
Left message on VM to return call 

## 2014-11-06 NOTE — Assessment & Plan Note (Signed)
Managed without medications, except for prn alprazolam.  The risks and benefits of benzodiazepine use were discussed with patient today including excessive sedation leading to respiratory depression,  impaired thinking/driving, and addiction.  Patient was advised to avoid concurrent use with alcohol, to use medication only as needed and not to share with others  .

## 2014-11-09 ENCOUNTER — Encounter: Payer: Self-pay | Admitting: *Deleted

## 2014-11-11 ENCOUNTER — Telehealth: Payer: Self-pay | Admitting: *Deleted

## 2014-11-11 NOTE — Telephone Encounter (Signed)
Patient stated that she was prescribed a face cream, and is now out. She stated that the medication worked for her and wanted to question if she should continue with the prednisone that was also prescribed. She would like a call back on what Dr. Darrick Huntsman advise. Thanks

## 2014-11-11 NOTE — Telephone Encounter (Signed)
I don't know what face cream she is referring to, since it didn't come from me.  The minocycline and prednisone , once completely taken, do not need refilling

## 2014-11-13 NOTE — Telephone Encounter (Signed)
Left message for patient to return call to office. 

## 2014-11-17 NOTE — Telephone Encounter (Signed)
Left message for patient to call office.  

## 2014-12-21 ENCOUNTER — Encounter: Payer: Self-pay | Admitting: Nurse Practitioner

## 2014-12-21 ENCOUNTER — Ambulatory Visit (INDEPENDENT_AMBULATORY_CARE_PROVIDER_SITE_OTHER): Payer: BLUE CROSS/BLUE SHIELD | Admitting: Nurse Practitioner

## 2014-12-21 VITALS — BP 130/80 | HR 78 | Temp 98.4°F | Resp 14 | Ht 65.0 in | Wt 212.4 lb

## 2014-12-21 DIAGNOSIS — Z8669 Personal history of other diseases of the nervous system and sense organs: Secondary | ICD-10-CM

## 2014-12-21 DIAGNOSIS — F411 Generalized anxiety disorder: Secondary | ICD-10-CM | POA: Diagnosis not present

## 2014-12-21 MED ORDER — AMITRIPTYLINE HCL 25 MG PO TABS: 25.0000 mg | ORAL_TABLET | Freq: Every day | ORAL | Status: DC

## 2014-12-21 NOTE — Assessment & Plan Note (Addendum)
Pt is exhibiting mood swings and tearfulness at today's visit. She will cry then laugh, launch into a story then go off on a tangent and slowly make it back to the point. She does not recall taking Elavil. She takes xanax as needed. Start amitriptyline again 25 mg at night. Will follow up in 2 weeks.  *Unsure of Bipolar disorder with mood swings- pt denies manic episodes after giving some examples of what that might be.

## 2014-12-21 NOTE — Progress Notes (Signed)
Patient ID: Jamie Weber, female    DOB: 1985/05/27  Age: 29 y.o. MRN: [id number]  CC: Migraine   HPI Jamie Weber presents for migraine frequency due to depression over 3 months.   1) Pt was stable on Elavil and reportedly they had decreased in frequency in early August at her physical with Dr. Darrick Huntsman.   Stressors at home, new job, husband was in an accident in 2015, but patient makes it seem like this just happened.   Very emotional and cries easily. Pt reports feeling moody and tearful frequently.  2) Headache-Yesterday- Right side of head and felt severe, nauseated, photophobia and phonophobia   Rested and it was helpful   Pt reports she got a bottle of "15 big white pills", but does not remember name. Amitriptyline, patient does not remember taking in the past. Last documented in 11/04/14 with her physical.   Pt reports taking home pregnancy tests that were negative   History Jamie Weber has a past medical history of Allergy and Depression.   She has past surgical history that includes laparoscopy (September 03, 2009) and Dilation and curettage of uterus (Jun 2011).   Her family history includes Cancer in her maternal grandmother; Heart disease in her paternal grandfather; Hyperlipidemia in her paternal grandmother; Hypothyroidism in her father.She reports that she quit smoking about 4 years ago. She has never used smokeless tobacco. She reports that she drinks alcohol. She reports that she does not use illicit drugs.  Outpatient Prescriptions Prior to Visit  Medication Sig Dispense Refill  . ALPRAZolam (XANAX) 0.5 MG tablet Take 1 tablet (0.5 mg total) by mouth at bedtime as needed for anxiety. 30 tablet 5  . amitriptyline (ELAVIL) 25 MG tablet Take 1 tablet (25 mg total) by mouth at bedtime. Increase weekly by 25 mg to maxium dose 100 mg 90 tablet 3  . SUMAtriptan (IMITREX) 50 MG tablet 1 tablet as needed at onset of headache. Max 2 daily 15 tablet 5  . minocycline (MINOCIN,DYNACIN)  100 MG capsule Take 1 capsule (100 mg total) by mouth 2 (two) times daily. (Patient not taking: Reported on 12/21/2014) 14 capsule 0  . predniSONE (DELTASONE) 10 MG tablet 6 tablets on Day 1 , then reduce by 1 tablet daily until gone (Patient not taking: Reported on 12/21/2014) 21 tablet 0   No facility-administered medications prior to visit.    ROS Review of Systems  Psychiatric/Behavioral: Positive for decreased concentration and agitation. The patient is nervous/anxious.        Lack of energy Avoids social situations    Objective:  BP 130/80 mmHg  Pulse 78  Temp(Src) 98.4 F (36.9 C)  Resp 14  Ht  (1.651 m)  Wt 212 lb 6.4 oz (96.344 kg)  BMI 35.35 kg/m2  SpO2 96%  Physical Exam  Constitutional: She is oriented to person, place, and time. She appears well-developed and well-nourished. No distress.  HENT:  Head: Normocephalic and atraumatic.  Right Ear: External ear normal.  Left Ear: External ear normal.  Cardiovascular: Normal rate, regular rhythm and normal heart sounds.  Exam reveals no gallop and no friction rub.   No murmur heard. Pulmonary/Chest: Effort normal and breath sounds normal. No respiratory distress. She has no wheezes. She has no rales. She exhibits no tenderness.  Neurological: She is alert and oriented to person, place, and time. No cranial nerve deficit. She exhibits normal muscle tone. Coordination normal.  Tearful. Dressed appropriately.  Skin: Skin is warm and dry. No rash  noted. She is not diaphoretic.  Psychiatric: She has a normal mood and affect. Her behavior is normal. Judgment and thought content normal.   Assessment & Plan:   Jamie Weber was seen today for migraine.  Diagnoses and all orders for this visit:  Generalized anxiety disorder  History of migraine headaches  Other orders -     amitriptyline (ELAVIL) 25 MG tablet; Take 1 tablet (25 mg total) by mouth at bedtime.  I have discontinued Jamie Weber's minocycline, predniSONE, and  SUMAtriptan. I have also changed her amitriptyline. Additionally, I am having her maintain her ALPRAZolam.  Meds ordered this encounter  Medications  . amitriptyline (ELAVIL) 25 MG tablet    Sig: Take 1 tablet (25 mg total) by mouth at bedtime.    Dispense:  30 tablet    Refill:  1    Order Specific Question:  Supervising Provider    Answer:  Sherlene Shams [[id number]]     Follow-up: Return in about 2 weeks (around 01/04/2015).

## 2014-12-21 NOTE — Progress Notes (Signed)
Pre visit review using our clinic review tool, if applicable. No additional management support is needed unless otherwise documented below in the visit note. 

## 2014-12-21 NOTE — Assessment & Plan Note (Signed)
Patient was documented at last visit in August as stable on Elavil. Pt reports she does not remember being on this and does not believe the medication she is currently on is Elavil. Sent in 25 mg to take at night to help with migraines and depression/anxiety. FU in 2 weeks.

## 2014-12-21 NOTE — Patient Instructions (Signed)
Try the Amitriptyline 25 mg one tablet at night. Follow up in 2 weeks.

## 2015-01-05 ENCOUNTER — Ambulatory Visit (INDEPENDENT_AMBULATORY_CARE_PROVIDER_SITE_OTHER): Payer: BLUE CROSS/BLUE SHIELD | Admitting: Nurse Practitioner

## 2015-01-05 VITALS — BP 110/80 | HR 89 | Temp 97.5°F | Resp 14 | Ht 65.0 in | Wt 213.0 lb

## 2015-01-05 DIAGNOSIS — Z8669 Personal history of other diseases of the nervous system and sense organs: Secondary | ICD-10-CM | POA: Diagnosis not present

## 2015-01-05 DIAGNOSIS — F411 Generalized anxiety disorder: Secondary | ICD-10-CM

## 2015-01-05 MED ORDER — AMITRIPTYLINE HCL 50 MG PO TABS: 50.0000 mg | ORAL_TABLET | Freq: Every day | ORAL | Status: DC

## 2015-01-05 MED ORDER — SUMATRIPTAN SUCCINATE 25 MG PO TABS: 25.0000 mg | ORAL_TABLET | Freq: Once | ORAL | Status: DC

## 2015-01-05 NOTE — Progress Notes (Signed)
Pre visit review using our clinic review tool, if applicable. No additional management support is needed unless otherwise documented below in the visit note. 

## 2015-01-05 NOTE — Patient Instructions (Addendum)
Call us if Imitrex after 2 tablets is not helpful after 24 hours.

## 2015-01-05 NOTE — Progress Notes (Signed)
Patient ID: Jamie Weber, female    DOB: 1985-11-14  Age: 29 y.o. MRN: [id number]  CC: Follow-up   HPI Jamie Weber presents for follow up of depression and migraines.   1) Xanax- Last dose last week approx.   2) Pain medication last night from her husband because her migraine was so bad.   3) Elavil- Helpful for depression  Left side- steady for 2 days then intermittent for 2 days prior to this. Nausea, photophobia   History Jamie Weber has a past medical history of Allergy and Depression.   Jamie Weber has past surgical history that includes laparoscopy (September 03, 2009) and Dilation and curettage of uterus (Jun 2011).   Her family history includes Cancer in her maternal grandmother; Heart disease in her paternal grandfather; Hyperlipidemia in her paternal grandmother; Hypothyroidism in her father.Jamie Weber reports that Jamie Weber quit smoking about 4 years ago. Jamie Weber has never used smokeless tobacco. Jamie Weber reports that Jamie Weber drinks alcohol. Jamie Weber reports that Jamie Weber does not use illicit drugs.  Outpatient Prescriptions Prior to Visit  Medication Sig Dispense Refill  . ALPRAZolam (XANAX) 0.5 MG tablet Take 1 tablet (0.5 mg total) by mouth at bedtime as needed for anxiety. 30 tablet 5  . amitriptyline (ELAVIL) 25 MG tablet Take 1 tablet (25 mg total) by mouth at bedtime. 30 tablet 1   No facility-administered medications prior to visit.    ROS Review of Systems  Constitutional: Negative for fever, chills, diaphoresis and fatigue.  Eyes: Positive for photophobia.  Respiratory: Negative for chest tightness, shortness of breath and wheezing.   Cardiovascular: Negative for chest pain, palpitations and leg swelling.  Gastrointestinal: Positive for nausea. Negative for vomiting and diarrhea.  Skin: Negative for rash.  Neurological: Positive for headaches. Negative for dizziness, weakness and numbness.  Psychiatric/Behavioral: Negative for suicidal ideas and sleep disturbance. The patient is nervous/anxious.      Objective:  BP 110/80 mmHg  Pulse 89  Temp(Src) 97.5 F (36.4 C)  Resp 14  Ht  (1.651 m)  Wt 213 lb (96.616 kg)  BMI 35.44 kg/m2  SpO2 97%  Physical Exam  Constitutional: Jamie Weber is oriented to person, place, and time. Jamie Weber appears well-developed and well-nourished. No distress.  HENT:  Head: Normocephalic and atraumatic.  Right Ear: External ear normal.  Left Ear: External ear normal.  Eyes: EOM are normal. Pupils are equal, round, and reactive to light. Right eye exhibits no discharge. Left eye exhibits no discharge. No scleral icterus.  Photophobia  Cardiovascular: Normal rate, regular rhythm and normal heart sounds.   Pulmonary/Chest: Effort normal and breath sounds normal. No respiratory distress. Jamie Weber has no wheezes. Jamie Weber has no rales. Jamie Weber exhibits no tenderness.  Neurological: Jamie Weber is alert and oriented to person, place, and time. No cranial nerve deficit. Jamie Weber exhibits normal muscle tone. Coordination normal.  Skin: Skin is warm and dry. No rash noted. Jamie Weber is not diaphoretic.  Psychiatric: Jamie Weber has a normal mood and affect. Her behavior is normal. Judgment and thought content normal.  Depression anxiety seems to be under control today   Assessment & Plan:   Jamie Weber was seen today for follow-up.  Diagnoses and all orders for this visit:  History of migraine headaches  Generalized anxiety disorder  Other orders -     SUMAtriptan (IMITREX) 25 MG tablet; Take 1 tablet (25 mg total) by mouth once. May repeat in 2 hours if headache persists or recurs. -     amitriptyline (ELAVIL) 50 MG tablet; Take 1 tablet (50  mg total) by mouth at bedtime.  I have discontinued Ms. Jamie Weber's amitriptyline. I am also having her start on SUMAtriptan and amitriptyline. Additionally, I am having her maintain her ALPRAZolam.  Meds ordered this encounter  Medications  . SUMAtriptan (IMITREX) 25 MG tablet    Sig: Take 1 tablet (25 mg total) by mouth once. May repeat in 2 hours if headache  persists or recurs.    Dispense:  10 tablet    Refill:  0    Order Specific Question:  Supervising Provider    Answer:  Jamie Weber [[id number]]  . amitriptyline (ELAVIL) 50 MG tablet    Sig: Take 1 tablet (50 mg total) by mouth at bedtime.    Dispense:  30 tablet    Refill:  0    Order Specific Question:  Supervising Provider    Answer:  Jamie Weber [[id number]]     Follow-up: Return if symptoms worsen or fail to improve.

## 2015-01-06 ENCOUNTER — Encounter: Payer: Self-pay | Admitting: Nurse Practitioner

## 2015-01-06 NOTE — Assessment & Plan Note (Addendum)
Stable with intermittent Xanax. Patient will go up to Elavil 50 mg at nighttime. Patient states this is very helpful. Denies suicidal or homicidal ideations.

## 2015-01-06 NOTE — Assessment & Plan Note (Signed)
Patient was taking 2 tablets of Elavil at nighttime and it did help with the depression. Patient has had a headache for 4 days with the last 2 days being constant. Associated nausea and photophobia are present. Patient states it is on the left side of her head. Patient has not taken anything other than Excedrin Migraine, which was not helpful. I will call in for Imitrex 25 mg patient is aware of instructions as she has taken this prior.

## 2015-01-29 ENCOUNTER — Encounter: Payer: Self-pay | Admitting: Internal Medicine

## 2015-01-31 ENCOUNTER — Other Ambulatory Visit: Payer: Self-pay | Admitting: Nurse Practitioner

## 2015-02-01 ENCOUNTER — Other Ambulatory Visit: Payer: Self-pay | Admitting: Nurse Practitioner

## 2015-02-22 ENCOUNTER — Other Ambulatory Visit: Payer: Self-pay | Admitting: Nurse Practitioner

## 2015-03-25 ENCOUNTER — Other Ambulatory Visit: Payer: Self-pay | Admitting: Surgical

## 2015-03-25 MED ORDER — SUMATRIPTAN SUCCINATE 25 MG PO TABS: ORAL_TABLET | ORAL | Status: DC

## 2015-04-05 ENCOUNTER — Other Ambulatory Visit: Payer: Self-pay | Admitting: Internal Medicine

## 2015-04-21 ENCOUNTER — Other Ambulatory Visit: Payer: Self-pay | Admitting: Nurse Practitioner

## 2015-05-13 ENCOUNTER — Other Ambulatory Visit: Payer: Self-pay | Admitting: Internal Medicine

## 2015-05-14 NOTE — Telephone Encounter (Signed)
Last OV with You 8/16 ok to fill?

## 2015-05-17 NOTE — Telephone Encounter (Signed)
Refill for 30 days only.  OFFICE VISIT NEEDED prior to any more refills 

## 2015-05-18 NOTE — Telephone Encounter (Signed)
Faxed. A voicemail was left and told a office visit was needed for anymore refills.

## 2015-06-30 ENCOUNTER — Ambulatory Visit
Admission: RE | Admit: 2015-06-30 | Discharge: 2015-06-30 | Disposition: A | Discharge status: Home / Self Care | Payer: BLUE CROSS/BLUE SHIELD | Source: Ambulatory Visit | Attending: Unknown Physician Specialty | Admitting: Unknown Physician Specialty

## 2015-06-30 ENCOUNTER — Other Ambulatory Visit: Payer: Self-pay | Admitting: Unknown Physician Specialty

## 2015-06-30 DIAGNOSIS — J90 Pleural effusion, not elsewhere classified: Secondary | ICD-10-CM | POA: Diagnosis not present

## 2015-06-30 DIAGNOSIS — R1011 Right upper quadrant pain: Secondary | ICD-10-CM | POA: Diagnosis present

## 2015-07-01 ENCOUNTER — Ambulatory Visit
Admission: RE | Admit: 2015-07-01 | Discharge: 2015-07-01 | Disposition: A | Discharge status: Home / Self Care | Payer: BLUE CROSS/BLUE SHIELD | Source: Ambulatory Visit | Attending: Unknown Physician Specialty | Admitting: Unknown Physician Specialty

## 2015-07-01 ENCOUNTER — Ambulatory Visit: Admission: RE | Admit: 2015-07-01 | Payer: BLUE CROSS/BLUE SHIELD | Source: Ambulatory Visit

## 2015-07-01 DIAGNOSIS — R1011 Right upper quadrant pain: Secondary | ICD-10-CM | POA: Insufficient documentation

## 2015-07-01 MED ORDER — IOPAMIDOL (ISOVUE-370) INJECTION 76%
100.0000 mL | Freq: Once | INTRAVENOUS | Status: AC | PRN
  Administered 2015-07-01: 100 mL via INTRAVENOUS

## 2015-11-11 ENCOUNTER — Telehealth: Payer: Self-pay | Admitting: Internal Medicine

## 2015-11-11 NOTE — Telephone Encounter (Signed)
No it was not placed here, so can't remove it, she will need to go where it was done. thanks

## 2015-11-11 NOTE — Telephone Encounter (Signed)
Pt called about wanting to know if she had the implanon put in her arm at our office? And can we take it out? Pt states a vm can be left.    Call pt @ [phone]. Thank you!

## 2015-11-11 NOTE — Telephone Encounter (Signed)
Ok. Let pt a msg to call the office. Thank you!

## 2016-01-31 ENCOUNTER — Ambulatory Visit
Admission: EM | Admit: 2016-01-31 | Discharge: 2016-01-31 | Disposition: A | Discharge status: Home / Self Care | Payer: BLUE CROSS/BLUE SHIELD | Attending: Family Medicine | Admitting: Family Medicine

## 2016-01-31 DIAGNOSIS — B349 Viral infection, unspecified: Secondary | ICD-10-CM

## 2016-01-31 DIAGNOSIS — A084 Viral intestinal infection, unspecified: Secondary | ICD-10-CM | POA: Diagnosis not present

## 2016-01-31 LAB — CBC WITH DIFFERENTIAL/PLATELET
Basophils Absolute: 0.1 10*3/uL (ref 0–0.1)
Basophils Relative: 1 %
EOS ABS: 0 10*3/uL (ref 0–0.7)
EOS PCT: 0 %
HCT: 39.7 % (ref 35.0–47.0)
HEMOGLOBIN: 13.4 g/dL (ref 12.0–16.0)
LYMPHS PCT: 9 %
Lymphs Abs: 0.7 10*3/uL — ABNORMAL LOW (ref 1.0–3.6)
MCH: 29.2 pg (ref 26.0–34.0)
MCHC: 33.8 g/dL (ref 32.0–36.0)
MCV: 86.6 fL (ref 80.0–100.0)
Monocytes Absolute: 0.7 10*3/uL (ref 0.2–0.9)
Monocytes Relative: 9 %
NEUTROS PCT: 81 %
Neutro Abs: 6.3 10*3/uL (ref 1.4–6.5)
Platelets: 237 10*3/uL (ref 150–440)
RBC: 4.58 MIL/uL (ref 3.80–5.20)
RDW: 13.7 % (ref 11.5–14.5)
WBC: 7.7 10*3/uL (ref 3.6–11.0)

## 2016-01-31 LAB — COMPREHENSIVE METABOLIC PANEL
ALK PHOS: 53 U/L (ref 38–126)
ALT: 33 U/L (ref 14–54)
ANION GAP: 9 (ref 5–15)
AST: 31 U/L (ref 15–41)
Albumin: 4.1 g/dL (ref 3.5–5.0)
BUN: 7 mg/dL (ref 6–20)
CHLORIDE: 102 mmol/L (ref 101–111)
CO2: 21 mmol/L — ABNORMAL LOW (ref 22–32)
Calcium: 8.7 mg/dL — ABNORMAL LOW (ref 8.9–10.3)
Creatinine, Ser: 0.63 mg/dL (ref 0.44–1.00)
Glucose, Bld: 134 mg/dL — ABNORMAL HIGH (ref 65–99)
POTASSIUM: 3.8 mmol/L (ref 3.5–5.1)
SODIUM: 132 mmol/L — AB (ref 135–145)
Total Bilirubin: 0.2 mg/dL — ABNORMAL LOW (ref 0.3–1.2)
Total Protein: 7.3 g/dL (ref 6.5–8.1)

## 2016-01-31 LAB — RAPID INFLUENZA A&B ANTIGENS
Influenza A (ARMC): NEGATIVE
Influenza B (ARMC): NEGATIVE

## 2016-01-31 MED ORDER — ONDANSETRON 8 MG PO TBDP: 8.0000 mg | ORAL_TABLET | Freq: Two times a day (BID) | ORAL | 0 refills | Status: DC

## 2016-01-31 MED ORDER — ACETAMINOPHEN 500 MG PO TABS
1000.0000 mg | ORAL_TABLET | Freq: Once | ORAL | Status: AC
  Administered 2016-01-31: 1000 mg via ORAL

## 2016-01-31 MED ORDER — OSELTAMIVIR PHOSPHATE 75 MG PO CAPS: 75.0000 mg | ORAL_CAPSULE | Freq: Two times a day (BID) | ORAL | 0 refills | Status: DC

## 2016-01-31 MED ORDER — ONDANSETRON 8 MG PO TBDP
8.0000 mg | ORAL_TABLET | Freq: Once | ORAL | Status: AC
  Administered 2016-01-31: 8 mg via ORAL

## 2016-01-31 NOTE — ED Triage Notes (Addendum)
Patient c/o body aches, that started on Saturday. She says she started shaking really bad and had a fever and now her throat hurts and she cant take a deep breath and her left ear is hurting. Pt says she is dizzy when she walks and she has taken OTC for the fever and it brings it down some but it comes right back.

## 2016-01-31 NOTE — ED Provider Notes (Signed)
[facility]    CSN: [id number] Arrival date & time: 01/31/16  [id number]     History   Chief Complaint Chief Complaint  Patient presents with  . Emesis    HPI Jamie Weber is a 30 y.o. female.   The history is provided by the patient.  URI  Presenting symptoms: congestion, fatigue, fever and sore throat   Severity:  Moderate Onset quality:  Sudden Duration:  2 days Timing:  Constant Progression:  Worsening Chronicity:  New Relieved by:  Nothing Ineffective treatments:  OTC medications Associated symptoms: myalgias   Associated symptoms comment:  Diarrhea Risk factors: not elderly, no chronic cardiac disease, no chronic kidney disease, no chronic respiratory disease, no diabetes mellitus, no immunosuppression, no recent illness, no recent travel and no sick contacts     Past Medical History:  Diagnosis Date  . Allergy    history of hives controoled with zyrtec  . Depression     Patient Active Problem List   Diagnosis Date Noted  . Encounter for preventive health examination 08/09/2013  . Generalized anxiety disorder 08/07/2012  . Sciatica of left side 03/03/2011  . Hormonal contraceptive 01/03/2011  . Pap smear for cervical cancer screening 01/03/2011  . Obesity (BMI 30-39.9) 01/03/2011  . Allergy   . History of migraine headaches     Past Surgical History:  Procedure Laterality Date  . DILATION AND CURETTAGE OF UTERUS  Jun 2011   normal,. Kincius  . LAPAROSCOPY  September 03, 2009   hysteroscopy and D&C, Dr. Harold HedgeKincius    OB History    No data available       Home Medications    Prior to Admission medications   Medication Sig Start Date End Date Taking? Authorizing Provider  ALPRAZolam Prudy Feeler(XANAX) 0.5 MG tablet TAKE 1 TABLET BY MOUTH AT BEDTIME AS NEEDED FOR ANXIETY 05/17/15   Sherlene Shamseresa L Tullo, MD  amitriptyline (ELAVIL) 50 MG tablet TAKE 1 TABLET (50 MG TOTAL) BY MOUTH AT BEDTIME. 04/22/15   Carollee Leitzarrie M Doss, NP  omeprazole (PRILOSEC) 40 MG capsule  TAKE 1 CAPSULE BY MOUTH EVERY DAY 04/06/15   Sherlene Shamseresa L Tullo, MD  ondansetron (ZOFRAN ODT) 8 MG disintegrating tablet Take 1 tablet (8 mg total) by mouth 2 (two) times daily. 01/31/16   Payton Mccallumrlando Christen Bedoya, MD  oseltamivir (TAMIFLU) 75 MG capsule Take 1 capsule (75 mg total) by mouth 2 (two) times daily. 01/31/16   Payton Mccallumrlando Deloris Mittag, MD  SUMAtriptan (IMITREX) 25 MG tablet TAKE 1 TABLET (25 MG TOTAL) BY MOUTH ONCE. MAY REPEAT IN 2 HOURS IF HEADACHE PERSISTS OR RECURS. 03/25/15   Carollee Leitzarrie M Doss, NP    Family History Family History  Problem Relation Age of Onset  . Hypothyroidism Father   . Cancer Maternal Grandmother     bilateral breast ca  . Hyperlipidemia Paternal Grandmother   . Heart disease Paternal Grandfather     Social History Social History  Substance Use Topics  . Smoking status: Former Smoker    Quit date: 10/03/2010  . Smokeless tobacco: Never Used  . Alcohol use Yes     Comment: occasional     Allergies   Review of patient's allergies indicates no known allergies.   Review of Systems Review of Systems  Constitutional: Positive for fatigue and fever.  HENT: Positive for congestion and sore throat.   Musculoskeletal: Positive for myalgias.     Physical Exam Triage Vital Signs ED Triage Vitals  Enc Vitals Group     BP  01/31/16 1011 131/78     Pulse Rate 01/31/16 1011 (!) 111     Resp 01/31/16 1011 18     Temp 01/31/16 1011 (!) 102.7 F (39.3 C)     Temp Source 01/31/16 1011 Oral     SpO2 01/31/16 1011 100 %     Weight 01/31/16 1013 240 lb (108.9 kg)     Height 01/31/16 1013 5\' 5"  (1.651 m)     Head Circumference --      Peak Flow --      Pain Score 01/31/16 1015 9     Pain Loc --      Pain Edu? --      Excl. in GC? --    No data found.   Updated Vital Signs BP 129/73 (BP Location: Right Arm)   Pulse (!) 102   Temp 100.2 F (37.9 C) (Oral)   Resp 18   Ht 5\' 5"  (1.651 m)   Wt 240 lb (108.9 kg)   SpO2 98%   BMI 39.94 kg/m   Visual Acuity Right Eye  Distance:   Left Eye Distance:   Bilateral Distance:    Right Eye Near:   Left Eye Near:    Bilateral Near:     Physical Exam  Constitutional: She is oriented to person, place, and time. She appears well-developed and well-nourished. No distress.  HENT:  Head: Normocephalic.  Right Ear: Tympanic membrane, external ear and ear canal normal.  Left Ear: Tympanic membrane, external ear and ear canal normal.  Nose: Nose normal.  Mouth/Throat: Oropharynx is clear and moist and mucous membranes are normal.  Eyes: Conjunctivae and EOM are normal. Pupils are equal, round, and reactive to light. Right eye exhibits no discharge. Left eye exhibits no discharge. No scleral icterus.  Neck: Normal range of motion. Neck supple. No JVD present. No tracheal deviation present. No thyromegaly present.  Cardiovascular: Normal rate, regular rhythm, normal heart sounds and intact distal pulses.   No murmur heard. Pulmonary/Chest: Effort normal and breath sounds normal. No stridor. No respiratory distress. She has no wheezes. She has no rales. She exhibits no tenderness.  Abdominal: Soft. Bowel sounds are normal. She exhibits no distension and no mass. There is no tenderness. There is no rebound and no guarding.  Musculoskeletal: She exhibits no edema or tenderness.  Lymphadenopathy:    She has no cervical adenopathy.  Neurological: She is alert and oriented to person, place, and time. She has normal reflexes.  Skin: Skin is warm and dry. No rash noted. She is not diaphoretic. No erythema. No pallor.  Psychiatric: She has a normal mood and affect. Her behavior is normal. Judgment and thought content normal.  Vitals reviewed.    UC Treatments / Results  Labs (all labs ordered are listed, but only abnormal results are displayed) Labs Reviewed  CBC WITH DIFFERENTIAL/PLATELET - Abnormal; Notable for the following:       Result Value   Lymphs Abs 0.7 (*)    All other components within normal limits    COMPREHENSIVE METABOLIC PANEL - Abnormal; Notable for the following:    Sodium 132 (*)    CO2 21 (*)    Glucose, Bld 134 (*)    Calcium 8.7 (*)    Total Bilirubin 0.2 (*)    All other components within normal limits  RAPID INFLUENZA A&B ANTIGENS ([facility] ONLY)    EKG  EKG Interpretation None       Radiology No results found.  Procedures Procedures (  including critical care time)  Medications Ordered in UC Medications  acetaminophen (TYLENOL) tablet 1,000 mg (1,000 mg Oral Given 01/31/16 1027)  ondansetron (ZOFRAN-ODT) disintegrating tablet 8 mg (8 mg Oral Given 01/31/16 1120)     Initial Impression / Assessment and Plan / UC Course  I have reviewed the triage vital signs and the nursing notes.  Pertinent labs & imaging results that were available during my care of the patient were reviewed by me and considered in my medical decision making (see chart for details).  Clinical Course      Final Clinical Impressions(s) / UC Diagnoses   Final diagnoses:  Viral gastroenteritis  Acute viral syndrome    New Prescriptions Discharge Medication List as of 01/31/2016 12:31 PM    START taking these medications   Details  ondansetron (ZOFRAN ODT) 8 MG disintegrating tablet Take 1 tablet (8 mg total) by mouth 2 (two) times daily., Starting Mon 01/31/2016, Print    oseltamivir (TAMIFLU) 75 MG capsule Take 1 capsule (75 mg total) by mouth 2 (two) times daily., Starting Mon 01/31/2016, Print       1. Lab results and diagnosis reviewed with patient 2. rx as per orders above; reviewed possible side effects, interactions, risks and benefits  3. Recommend supportive treatment with rest, fluids 4. Follow-up prn if symptoms worsen or don't improve   Payton Mccallumrlando Aiva Miskell, MD 02/02/16 1312

## 2016-08-18 ENCOUNTER — Telehealth: Payer: Self-pay | Admitting: Internal Medicine

## 2016-08-18 ENCOUNTER — Encounter: Payer: BLUE CROSS/BLUE SHIELD | Admitting: Internal Medicine

## 2016-08-18 DIAGNOSIS — Z0289 Encounter for other administrative examinations: Secondary | ICD-10-CM

## 2016-08-18 NOTE — Telephone Encounter (Signed)
FYI - Pt called and cancelled appt states that she woke up sick. Pt states that she only made her appt yesterday.

## 2016-08-18 NOTE — Telephone Encounter (Signed)
FYI

## 2016-08-21 ENCOUNTER — Encounter: Payer: Self-pay | Admitting: Internal Medicine

## 2016-08-21 DIAGNOSIS — Z0289 Encounter for other administrative examinations: Secondary | ICD-10-CM

## 2016-09-18 ENCOUNTER — Encounter: Payer: Self-pay | Admitting: Advanced Practice Midwife

## 2016-09-18 ENCOUNTER — Ambulatory Visit (INDEPENDENT_AMBULATORY_CARE_PROVIDER_SITE_OTHER): Payer: BLUE CROSS/BLUE SHIELD | Admitting: Advanced Practice Midwife

## 2016-09-18 DIAGNOSIS — Z3046 Encounter for surveillance of implantable subdermal contraceptive: Secondary | ICD-10-CM

## 2016-09-18 MED ORDER — NORELGESTROMIN-ETH ESTRADIOL 150-35 MCG/24HR TD PTWK: 1.0000 | MEDICATED_PATCH | TRANSDERMAL | 12 refills | Status: DC

## 2016-09-18 NOTE — Progress Notes (Signed)
     GYNECOLOGY PROCEDURE NOTE  The Nexplanon is not due to be removed until October of this year but the patient has had increased bleeding in the past two months and she states a burning sensation in her arm at the site of the implant. She is requesting removal now and desires the patch instead.   Nexplanon removal discussed in detail.  Risks of infection, bleeding, nerve injury all reviewed.  Patient understands risks and desires to proceed.  Verbal consent obtained.  Patient is certain she wants the Nexplanon removed.  All questions answered.  Procedure: Patient placed in dorsal supine with left arm above head, elbow flexed at 90 degrees, arm resting on examination table.  Nexplanon identified without problems.  Betadine scrub x3.  1 ml of 1% lidocaine injected under Nexplanon device without problems.  Sterile gloves applied.  Small 0.5cm incision made at distal tip of implanon device with 11 blade scalpel.  Nexplanon brought to incision and grasped with a small kelly clamp.  Nexplanon removed intact without problems.  Pressure applied to incision.  Hemostasis obtained.  Steri-strips applied, followed by bandage and compression dressing.  Patient tolerated procedure well.  No complications.   Assessment: 31 y.o. year old female now s/p uncomplicated Nexplanon removal.  Plan: 1.  Patient given post procedure precautions and asked to call for fever, chills, redness or drainage from her incision, bleeding from incision.  She understands she will likely have a small bruise near site of removal and can remove bandage tomorrow and steri-strips in approximately 1 week.  2) Contraception: Patch   Tresea MallJane Aurie Harroun, CNM

## 2016-10-06 ENCOUNTER — Encounter: Payer: BLUE CROSS/BLUE SHIELD | Admitting: Internal Medicine

## 2016-10-06 ENCOUNTER — Telehealth: Payer: Self-pay | Admitting: Internal Medicine

## 2016-10-06 DIAGNOSIS — Z0289 Encounter for other administrative examinations: Secondary | ICD-10-CM

## 2016-10-06 NOTE — Telephone Encounter (Signed)
This is pt 4th No Show Appt (3rd since 08/18/16). Please advise Kenney Housemananya if you would like to start dismissal.

## 2016-10-06 NOTE — Telephone Encounter (Signed)
Yes,  Dismissal for NO SHOWs is requested

## 2016-10-06 NOTE — Telephone Encounter (Signed)
Please advise 

## 2016-10-09 NOTE — Telephone Encounter (Signed)
Letter drafted and given to PCP. thanks

## 2016-10-11 ENCOUNTER — Telehealth: Payer: Self-pay | Admitting: Internal Medicine

## 2016-10-11 NOTE — Telephone Encounter (Signed)
Patient dismissed from [facility] by Duncan Dulleresa Tullo, MD, effective 10/09/2016. Dismissal letter sent out by certified/registered mail.PWR

## 2016-10-17 NOTE — Telephone Encounter (Signed)
10/17/16 recieved Green card from certified letter sent out to dismiss this patient by Dr.tullo. pwr

## 2016-11-07 ENCOUNTER — Ambulatory Visit: Payer: BLUE CROSS/BLUE SHIELD | Admitting: Advanced Practice Midwife

## 2016-12-29 ENCOUNTER — Ambulatory Visit: Payer: BLUE CROSS/BLUE SHIELD | Admitting: Obstetrics & Gynecology

## 2017-01-12 ENCOUNTER — Ambulatory Visit: Payer: Self-pay | Admitting: Maternal Newborn

## 2017-03-02 ENCOUNTER — Ambulatory Visit (INDEPENDENT_AMBULATORY_CARE_PROVIDER_SITE_OTHER): Payer: Medicaid Other | Admitting: Maternal Newborn

## 2017-03-02 ENCOUNTER — Encounter: Payer: Self-pay | Admitting: Maternal Newborn

## 2017-03-02 VITALS — BP 118/76 | Wt 244.0 lb

## 2017-03-02 DIAGNOSIS — Z98891 History of uterine scar from previous surgery: Secondary | ICD-10-CM | POA: Insufficient documentation

## 2017-03-02 DIAGNOSIS — Z3480 Encounter for supervision of other normal pregnancy, unspecified trimester: Secondary | ICD-10-CM

## 2017-03-02 DIAGNOSIS — O0990 Supervision of high risk pregnancy, unspecified, unspecified trimester: Secondary | ICD-10-CM | POA: Insufficient documentation

## 2017-03-02 NOTE — Progress Notes (Signed)
Confirmed @[facility]

## 2017-03-02 NOTE — Progress Notes (Signed)
03/02/2017   Chief Complaint: Amenorrhea, positive home pregnancy test, desires prenatal care.  Transfer of Care Patient: no  History of Present Illness: Ms. Jamie Weber is a 31 y.o. G3P1102 at 6w 1d based on Patient's last menstrual period on 01/18/2017, with an Estimated Date of Delivery: 10/25/2017, with the above CC.   Her periods were: regular periods every 28 days. Last period in October was shorter and lighter than normal, only lasting 3 days with light flow. She was using natural family planning (NFP) when she conceived.  She has Positive signs or symptoms of nausea/vomiting of pregnancy, breast tenderness, fatigue.  She has Negative signs or symptoms of miscarriage or preterm labor. She identifies Negative Zika risk factors for her and her partner On any different medications around the time she conceived/early pregnancy: No  History of varicella: Yes  She is experiencing daily heartburn with acid reflux.  ROS: A 12-point review of systems was performed and negative, except as stated in the above HPI.  OBGYN History: As per HPI. OB History  Gravida Para Term Preterm AB Living  4 2 1 1 1 2   SAB TAB Ectopic Multiple Live Births  1            # Outcome Date GA Lbr Len/2nd Weight Sex Delivery Anes PTL Lv  4 Current           3 Term 01/24/12   8 lb 4.8 oz (3.765 kg) F CS-Unspec     2 Preterm 11/28/08   4 lb 8 oz (2.041 kg) F Vag-Spont     1 SAB 07/11/06              Any issues with any prior pregnancies: Yes, SAB with G1, pre-eclampsia and preterm labor with G2, nausea and vomiting throughout pregnancy with G1 and G2. Any prior children are healthy, doing well, without any problems or issues: yes History of pap smears: Yes. Last pap smear 11/04/2014.  Abnormal: no  History of STIs: No   Past Medical History: Past Medical History:  Diagnosis Date  . Allergy    history of hives controoled with zyrtec  . Depression     Past Surgical History: Past Surgical History:    Procedure Laterality Date  . DILATION AND CURETTAGE OF UTERUS  Jun 2011   normal,. Kincius  . LAPAROSCOPY  September 03, 2009   hysteroscopy and D&C, Dr. Harold HedgeKincius    Family History:  Family History  Problem Relation Age of Onset  . Hypothyroidism Father   . Cancer Maternal Grandmother        bilateral breast ca  . Hyperlipidemia Paternal Grandmother   . Heart disease Paternal Grandfather    She denies any female cancers, bleeding or blood clotting disorders.   Her paternal great-aunt had a heart defect and did not survive past age 839. Her paternal great uncle had a lung defect and did not survive infancy. She denies any other history of intellectual disability, birth defects or genetic disorders in her or the FOB's history  Social History:  Social History   Socioeconomic History  . Marital status: Married    Spouse name: Not on file  . Number of children: Not on file  . Years of education: Not on file  . Highest education level: Not on file  Social Needs  . Financial resource strain: Not on file  . Food insecurity - worry: Not on file  . Food insecurity - inability: Not on file  . Transportation needs -  medical: Not on file  . Transportation needs - non-medical: Not on file  Occupational History  . Not on file  Tobacco Use  . Smoking status: Former Smoker    Last attempt to quit: 10/03/2010    Years since quitting: 6.4  . Smokeless tobacco: Never Used  Substance and Sexual Activity  . Alcohol use: Yes    Comment: occasional  . Drug use: No  . Sexual activity: Yes    Birth control/protection: Implant  Other Topics Concern  . Not on file  Social History Narrative  . Not on file   Any cats in the household: no Denies history of and current domestic violence.  Allergy: No Known Allergies  Current Outpatient Medications: No current outpatient medications on file.   Physical Exam:   BP 118/76   Wt 244 lb (110.7 kg)   LMP 01/18/2017   BMI 40.60 kg/m  Body mass  index is 40.6 kg/m. Constitutional: Well nourished, well developed female in no acute distress.  Neck:  Supple, normal appearance, and no thyromegaly  Cardiovascular: S1, S2 normal, no murmur, rub or gallop, regular rate and rhythm Respiratory:  Clear to auscultation bilaterally. Normal respiratory effort Abdomen: obese, no masses, no hernias; diffusely non tender to palpation, non-distended Breasts: right breast normal without mass, skin or nipple changes or axillary nodes, left breast normal without mass, skin or nipple changes or axillary nodes, risk and benefit of breast self-exam was discussed. Neuro/Psych:  Normal mood and affect.  Skin:  Warm and dry.  Lymphatic:  No inguinal lymphadenopathy.   Pelvic exam: is not limited by body habitus External genitalia, Bartholin's glands, Urethra, Skene's glands: within normal limits Vagina: within normal limits and with no blood in the vault  Cervix: normal appearing cervix without discharge or lesions, closed/long/high Uterus:  Enlarged consistent with pregnancy Adnexa:  normal adnexa and no mass, fullness, tenderness  Assessment: Ms. Jamie Weber is a 31 y.o. [id number] at 6w 1d based on Patient's last menstrual period on 01/18/2017, with an Estimated Date of Delivery: 10/25/2017, presenting for prenatal care.  Plan:  1) Avoid alcoholic beverages. 2) Patient encouraged not to smoke.  3) Discontinue the use of all non-medicinal drugs and chemicals.  4) Take prenatal vitamins daily.  5) Seatbelt use advised.  6) Nutrition, food safety (fish, cheese advisories, and high nitrite foods) and exercise discussed. 7) Hospital and practice style delivering at [facility] discussed.  8) Patient is asked about travel to areas at risk for the Zika virus, and counseled to avoid travel and exposure to mosquitoes or sexual partners who may have themselves been exposed to the virus. Testing is discussed, and will be ordered as appropriate.  9) [facility]  advised. 10) Genetic Screening, such as with 1st Trimester Screening, cell free fetal DNA, AFP testing, and Ultrasound, as well as with amniocentesis and CVS as appropriate, is discussed with patient. She plans to decline genetic testing this pregnancy.  11) Advised Zantac for heartburn.  Problem list reviewed and updated.  Return in about 1 week (around 03/09/2017) for ROB with dating US, GTT, NOB labs.  Marcelyn BruinsJacelyn Schmid, CNM [facility], [facility] 03/02/2017  3:30 PM

## 2017-03-02 NOTE — Patient Instructions (Signed)
First Trimester of Pregnancy The first trimester of pregnancy is from week 1 until the end of week 13 (months 1 through 3). A week after a sperm fertilizes an egg, the egg will implant on the wall of the uterus. This embryo will begin to develop into a baby. Genes from you and your partner will form the baby. The female genes will determine whether the baby will be a boy or a girl. At 6-8 weeks, the eyes and face will be formed, and the heartbeat can be seen on ultrasound. At the end of 12 weeks, all the baby's organs will be formed. Now that you are pregnant, you will want to do everything you can to have a healthy baby. Two of the most important things are to get good prenatal care and to follow your health care provider's instructions. Prenatal care is all the medical care you receive before the baby's birth. This care will help prevent, find, and treat any problems during the pregnancy and childbirth. Body changes during your first trimester Your body goes through many changes during pregnancy. The changes vary from woman to woman.  You may gain or lose a couple of pounds at first.  You may feel sick to your stomach (nauseous) and you may throw up (vomit). If the vomiting is uncontrollable, call your health care provider.  You may tire easily.  You may develop headaches that can be relieved by medicines. All medicines should be approved by your health care provider.  You may urinate more often. Painful urination may mean you have a bladder infection.  You may develop heartburn as a result of your pregnancy.  You may develop constipation because certain hormones are causing the muscles that push stool through your intestines to slow down.  You may develop hemorrhoids or swollen veins (varicose veins).  Your breasts may begin to grow larger and become tender. Your nipples may stick out more, and the tissue that surrounds them (areola) may become darker.  Your gums may bleed and may be  sensitive to brushing and flossing.  Dark spots or blotches (chloasma, mask of pregnancy) may develop on your face. This will likely fade after the baby is born.  Your menstrual periods will stop.  You may have a loss of appetite.  You may develop cravings for certain kinds of food.  You may have changes in your emotions from day to day, such as being excited to be pregnant or being concerned that something may go wrong with the pregnancy and baby.  You may have more vivid and strange dreams.  You may have changes in your hair. These can include thickening of your hair, rapid growth, and changes in texture. Some women also have hair loss during or after pregnancy, or hair that feels dry or thin. Your hair will most likely return to normal after your baby is born.  What to expect at prenatal visits During a routine prenatal visit:  You will be weighed to make sure you and the baby are growing normally.  Your blood pressure will be taken.  Your abdomen will be measured to track your baby's growth.  The fetal heartbeat will be listened to between weeks 10 and 14 of your pregnancy.  Test results from any previous visits will be discussed.  Your health care provider may ask you:  How you are feeling.  If you are feeling the baby move.  If you have had any abnormal symptoms, such as leaking fluid, bleeding, severe headaches,   or abdominal cramping.  If you are using any tobacco products, including cigarettes, chewing tobacco, and electronic cigarettes.  If you have any questions.  Other tests that may be performed during your first trimester include:  Blood tests to find your blood type and to check for the presence of any previous infections. The tests will also be used to check for low iron levels (anemia) and protein on red blood cells (Rh antibodies). Depending on your risk factors, or if you previously had diabetes during pregnancy, you may have tests to check for high blood  sugar that affects pregnant women (gestational diabetes).  Urine tests to check for infections, diabetes, or protein in the urine.  An ultrasound to confirm the proper growth and development of the baby.  Fetal screens for spinal cord problems (spina bifida) and Down syndrome.  HIV (human immunodeficiency virus) testing. Routine prenatal testing includes screening for HIV, unless you choose not to have this test.  You may need other tests to make sure you and the baby are doing well.  Follow these instructions at home: Medicines  Follow your health care provider's instructions regarding medicine use. Specific medicines may be either safe or unsafe to take during pregnancy.  Take a prenatal vitamin that contains at least 600 micrograms (mcg) of folic acid.  If you develop constipation, try taking a stool softener if your health care provider approves. Eating and drinking  Eat a balanced diet that includes fresh fruits and vegetables, whole grains, good sources of protein such as meat, eggs, or tofu, and low-fat dairy. Your health care provider will help you determine the amount of weight gain that is right for you.  Avoid raw meat and uncooked cheese. These carry germs that can cause birth defects in the baby.  Eating four or five small meals rather than three large meals a day may help relieve nausea and vomiting. If you start to feel nauseous, eating a few soda crackers can be helpful. Drinking liquids between meals, instead of during meals, also seems to help ease nausea and vomiting.  Limit foods that are high in fat and processed sugars, such as fried and sweet foods.  To prevent constipation: ? Eat foods that are high in fiber, such as fresh fruits and vegetables, whole grains, and beans. ? Drink enough fluid to keep your urine clear or pale yellow. Activity  Exercise only as directed by your health care provider. Most women can continue their usual exercise routine during  pregnancy. Try to exercise for 30 minutes at least 5 days a week. Exercising will help you: ? Control your weight. ? Stay in shape. ? Be prepared for labor and delivery.  Experiencing pain or cramping in the lower abdomen or lower back is a good sign that you should stop exercising. Check with your health care provider before continuing with normal exercises.  Try to avoid standing for long periods of time. Move your legs often if you must stand in one place for a long time.  Avoid heavy lifting.  Wear low-heeled shoes and practice good posture.  You may continue to have sex unless your health care provider tells you not to. Relieving pain and discomfort  Wear a good support bra to relieve breast tenderness.  Take warm sitz baths to soothe any pain or discomfort caused by hemorrhoids. Use hemorrhoid cream if your health care provider approves.  Rest with your legs elevated if you have leg cramps or low back pain.  If you develop   varicose veins in your legs, wear support hose. Elevate your feet for 15 minutes, 3-4 times a day. Limit salt in your diet. Prenatal care  Schedule your prenatal visits by the twelfth week of pregnancy. They are usually scheduled monthly at first, then more often in the last 2 months before delivery.  Write down your questions. Take them to your prenatal visits.  Keep all your prenatal visits as told by your health care provider. This is important. Safety  Wear your seat belt at all times when driving.  Make a list of emergency phone numbers, including numbers for family, friends, the hospital, and police and fire departments. General instructions  Ask your health care provider for a referral to a local prenatal education class. Begin classes no later than the beginning of month 6 of your pregnancy.  Ask for help if you have counseling or nutritional needs during pregnancy. Your health care provider can offer advice or refer you to specialists for help  with various needs.  Do not use hot tubs, steam rooms, or saunas.  Do not douche or use tampons or scented sanitary pads.  Do not cross your legs for long periods of time.  Avoid cat litter boxes and soil used by cats. These carry germs that can cause birth defects in the baby and possibly loss of the fetus by miscarriage or stillbirth.  Avoid all smoking, herbs, alcohol, and medicines not prescribed by your health care provider. Chemicals in these products affect the formation and growth of the baby.  Do not use any products that contain nicotine or tobacco, such as cigarettes and e-cigarettes. If you need help quitting, ask your health care provider. You may receive counseling support and other resources to help you quit.  Schedule a dentist appointment. At home, brush your teeth with a soft toothbrush and be gentle when you floss. Contact a health care provider if:  You have dizziness.  You have mild pelvic cramps, pelvic pressure, or nagging pain in the abdominal area.  You have persistent nausea, vomiting, or diarrhea.  You have a bad smelling vaginal discharge.  You have pain when you urinate.  You notice increased swelling in your face, hands, legs, or ankles.  You are exposed to fifth disease or chickenpox.  You are exposed to German measles (rubella) and have never had it. Get help right away if:  You have a fever.  You are leaking fluid from your vagina.  You have spotting or bleeding from your vagina.  You have severe abdominal cramping or pain.  You have rapid weight gain or loss.  You vomit blood or material that looks like coffee grounds.  You develop a severe headache.  You have shortness of breath.  You have any kind of trauma, such as from a fall or a car accident. Summary  The first trimester of pregnancy is from week 1 until the end of week 13 (months 1 through 3).  Your body goes through many changes during pregnancy. The changes vary from  woman to woman.  You will have routine prenatal visits. During those visits, your health care provider will examine you, discuss any test results you may have, and talk with you about how you are feeling. This information is not intended to replace advice given to you by your health care provider. Make sure you discuss any questions you have with your health care provider. Document Released: 03/14/2001 Document Revised: 03/01/2016 Document Reviewed: 03/01/2016 Elsevier Interactive Patient Education  2017 [employer]  

## 2017-03-04 LAB — URINE DRUG PANEL 7
AMPHETAMINES, URINE: NEGATIVE ng/mL
BARBITURATE QUANT UR: NEGATIVE ng/mL
Benzodiazepine Quant, Ur: NEGATIVE ng/mL
CANNABINOID QUANT UR: NEGATIVE ng/mL
COCAINE (METAB.): NEGATIVE ng/mL
Opiate Quant, Ur: NEGATIVE ng/mL
PCP Quant, Ur: NEGATIVE ng/mL

## 2017-03-04 LAB — URINE CULTURE: Organism ID, Bacteria: NO GROWTH

## 2017-03-04 LAB — GC/CHLAMYDIA PROBE AMP
CHLAMYDIA, DNA PROBE: NEGATIVE
Neisseria gonorrhoeae by PCR: NEGATIVE

## 2017-03-07 ENCOUNTER — Ambulatory Visit (INDEPENDENT_AMBULATORY_CARE_PROVIDER_SITE_OTHER): Payer: Medicaid Other

## 2017-03-07 ENCOUNTER — Ambulatory Visit: Payer: Medicaid Other

## 2017-03-07 ENCOUNTER — Encounter: Payer: Medicaid Other | Admitting: Advanced Practice Midwife

## 2017-03-07 ENCOUNTER — Encounter: Payer: Self-pay | Admitting: Maternal Newborn

## 2017-03-07 ENCOUNTER — Ambulatory Visit (INDEPENDENT_AMBULATORY_CARE_PROVIDER_SITE_OTHER): Payer: Medicaid Other | Admitting: Maternal Newborn

## 2017-03-07 VITALS — BP 120/68 | Wt 243.0 lb

## 2017-03-07 DIAGNOSIS — O219 Vomiting of pregnancy, unspecified: Secondary | ICD-10-CM

## 2017-03-07 DIAGNOSIS — Z3480 Encounter for supervision of other normal pregnancy, unspecified trimester: Secondary | ICD-10-CM

## 2017-03-07 DIAGNOSIS — Z362 Encounter for other antenatal screening follow-up: Secondary | ICD-10-CM | POA: Diagnosis not present

## 2017-03-07 MED ORDER — DOXYLAMINE-PYRIDOXINE 10-10 MG PO TBEC: 2.0000 | DELAYED_RELEASE_TABLET | Freq: Every day | ORAL | 5 refills | Status: DC

## 2017-03-07 NOTE — Patient Instructions (Signed)
First Trimester of Pregnancy The first trimester of pregnancy is from week 1 until the end of week 13 (months 1 through 3). A week after a sperm fertilizes an egg, the egg will implant on the wall of the uterus. This embryo will begin to develop into a baby. Genes from you and your partner will form the baby. The female genes will determine whether the baby will be a boy or a girl. At 6-8 weeks, the eyes and face will be formed, and the heartbeat can be seen on ultrasound. At the end of 12 weeks, all the baby's organs will be formed. Now that you are pregnant, you will want to do everything you can to have a healthy baby. Two of the most important things are to get good prenatal care and to follow your health care provider's instructions. Prenatal care is all the medical care you receive before the baby's birth. This care will help prevent, find, and treat any problems during the pregnancy and childbirth. Body changes during your first trimester Your body goes through many changes during pregnancy. The changes vary from woman to woman.  You may gain or lose a couple of pounds at first.  You may feel sick to your stomach (nauseous) and you may throw up (vomit). If the vomiting is uncontrollable, call your health care provider.  You may tire easily.  You may develop headaches that can be relieved by medicines. All medicines should be approved by your health care provider.  You may urinate more often. Painful urination may mean you have a bladder infection.  You may develop heartburn as a result of your pregnancy.  You may develop constipation because certain hormones are causing the muscles that push stool through your intestines to slow down.  You may develop hemorrhoids or swollen veins (varicose veins).  Your breasts may begin to grow larger and become tender. Your nipples may stick out more, and the tissue that surrounds them (areola) may become darker.  Your gums may bleed and may be  sensitive to brushing and flossing.  Dark spots or blotches (chloasma, mask of pregnancy) may develop on your face. This will likely fade after the baby is born.  Your menstrual periods will stop.  You may have a loss of appetite.  You may develop cravings for certain kinds of food.  You may have changes in your emotions from day to day, such as being excited to be pregnant or being concerned that something may go wrong with the pregnancy and baby.  You may have more vivid and strange dreams.  You may have changes in your hair. These can include thickening of your hair, rapid growth, and changes in texture. Some women also have hair loss during or after pregnancy, or hair that feels dry or thin. Your hair will most likely return to normal after your baby is born.  What to expect at prenatal visits During a routine prenatal visit:  You will be weighed to make sure you and the baby are growing normally.  Your blood pressure will be taken.  Your abdomen will be measured to track your baby's growth.  The fetal heartbeat will be listened to between weeks 10 and 14 of your pregnancy.  Test results from any previous visits will be discussed.  Your health care provider may ask you:  How you are feeling.  If you are feeling the baby move.  If you have had any abnormal symptoms, such as leaking fluid, bleeding, severe headaches,   or abdominal cramping.  If you are using any tobacco products, including cigarettes, chewing tobacco, and electronic cigarettes.  If you have any questions.  Other tests that may be performed during your first trimester include:  Blood tests to find your blood type and to check for the presence of any previous infections. The tests will also be used to check for low iron levels (anemia) and protein on red blood cells (Rh antibodies). Depending on your risk factors, or if you previously had diabetes during pregnancy, you may have tests to check for high blood  sugar that affects pregnant women (gestational diabetes).  Urine tests to check for infections, diabetes, or protein in the urine.  An ultrasound to confirm the proper growth and development of the baby.  Fetal screens for spinal cord problems (spina bifida) and Down syndrome.  HIV (human immunodeficiency virus) testing. Routine prenatal testing includes screening for HIV, unless you choose not to have this test.  You may need other tests to make sure you and the baby are doing well.  Follow these instructions at home: Medicines  Follow your health care provider's instructions regarding medicine use. Specific medicines may be either safe or unsafe to take during pregnancy.  Take a prenatal vitamin that contains at least 600 micrograms (mcg) of folic acid.  If you develop constipation, try taking a stool softener if your health care provider approves. Eating and drinking  Eat a balanced diet that includes fresh fruits and vegetables, whole grains, good sources of protein such as meat, eggs, or tofu, and low-fat dairy. Your health care provider will help you determine the amount of weight gain that is right for you.  Avoid raw meat and uncooked cheese. These carry germs that can cause birth defects in the baby.  Eating four or five small meals rather than three large meals a day may help relieve nausea and vomiting. If you start to feel nauseous, eating a few soda crackers can be helpful. Drinking liquids between meals, instead of during meals, also seems to help ease nausea and vomiting.  Limit foods that are high in fat and processed sugars, such as fried and sweet foods.  To prevent constipation: ? Eat foods that are high in fiber, such as fresh fruits and vegetables, whole grains, and beans. ? Drink enough fluid to keep your urine clear or pale yellow. Activity  Exercise only as directed by your health care provider. Most women can continue their usual exercise routine during  pregnancy. Try to exercise for 30 minutes at least 5 days a week. Exercising will help you: ? Control your weight. ? Stay in shape. ? Be prepared for labor and delivery.  Experiencing pain or cramping in the lower abdomen or lower back is a good sign that you should stop exercising. Check with your health care provider before continuing with normal exercises.  Try to avoid standing for long periods of time. Move your legs often if you must stand in one place for a long time.  Avoid heavy lifting.  Wear low-heeled shoes and practice good posture.  You may continue to have sex unless your health care provider tells you not to. Relieving pain and discomfort  Wear a good support bra to relieve breast tenderness.  Take warm sitz baths to soothe any pain or discomfort caused by hemorrhoids. Use hemorrhoid cream if your health care provider approves.  Rest with your legs elevated if you have leg cramps or low back pain.  If you develop   varicose veins in your legs, wear support hose. Elevate your feet for 15 minutes, 3-4 times a day. Limit salt in your diet. Prenatal care  Schedule your prenatal visits by the twelfth week of pregnancy. They are usually scheduled monthly at first, then more often in the last 2 months before delivery.  Write down your questions. Take them to your prenatal visits.  Keep all your prenatal visits as told by your health care provider. This is important. Safety  Wear your seat belt at all times when driving.  Make a list of emergency phone numbers, including numbers for family, friends, the hospital, and police and fire departments. General instructions  Ask your health care provider for a referral to a local prenatal education class. Begin classes no later than the beginning of month 6 of your pregnancy.  Ask for help if you have counseling or nutritional needs during pregnancy. Your health care provider can offer advice or refer you to specialists for help  with various needs.  Do not use hot tubs, steam rooms, or saunas.  Do not douche or use tampons or scented sanitary pads.  Do not cross your legs for long periods of time.  Avoid cat litter boxes and soil used by cats. These carry germs that can cause birth defects in the baby and possibly loss of the fetus by miscarriage or stillbirth.  Avoid all smoking, herbs, alcohol, and medicines not prescribed by your health care provider. Chemicals in these products affect the formation and growth of the baby.  Do not use any products that contain nicotine or tobacco, such as cigarettes and e-cigarettes. If you need help quitting, ask your health care provider. You may receive counseling support and other resources to help you quit.  Schedule a dentist appointment. At home, brush your teeth with a soft toothbrush and be gentle when you floss. Contact a health care provider if:  You have dizziness.  You have mild pelvic cramps, pelvic pressure, or nagging pain in the abdominal area.  You have persistent nausea, vomiting, or diarrhea.  You have a bad smelling vaginal discharge.  You have pain when you urinate.  You notice increased swelling in your face, hands, legs, or ankles.  You are exposed to fifth disease or chickenpox.  You are exposed to German measles (rubella) and have never had it. Get help right away if:  You have a fever.  You are leaking fluid from your vagina.  You have spotting or bleeding from your vagina.  You have severe abdominal cramping or pain.  You have rapid weight gain or loss.  You vomit blood or material that looks like coffee grounds.  You develop a severe headache.  You have shortness of breath.  You have any kind of trauma, such as from a fall or a car accident. Summary  The first trimester of pregnancy is from week 1 until the end of week 13 (months 1 through 3).  Your body goes through many changes during pregnancy. The changes vary from  woman to woman.  You will have routine prenatal visits. During those visits, your health care provider will examine you, discuss any test results you may have, and talk with you about how you are feeling. This information is not intended to replace advice given to you by your health care provider. Make sure you discuss any questions you have with your health care provider. Document Released: 03/14/2001 Document Revised: 03/01/2016 Document Reviewed: 03/01/2016 Elsevier Interactive Patient Education  2017 [employer]  

## 2017-03-07 NOTE — Progress Notes (Signed)
Routine Prenatal Care Visit  Subjective  Jamie Weber is a 31 y.o. [id number] at 1332w6d being seen today for ongoing prenatal care.  She is currently monitored for the following issues for this high-risk pregnancy and has Hormonal contraceptive; Allergy; History of migraine headaches; Pap smear for cervical cancer screening; Sciatica of left side; Generalized anxiety disorder; Encounter for preventive health examination; Supervision of other normal pregnancy, antepartum; and History of cesarean delivery on her problem list.  ----------------------------------------------------------------------------------- Patient reports nausea and vomiting.   Vag. Bleeding: None.   ----------------------------------------------------------------------------------- The following portions of the patient's history were reviewed and updated as appropriate: allergies, current medications, past family history, past medical history, past social history, past surgical history and problem list. Problem list updated.   Objective  Last menstrual period 01/18/2017. Pregravid weight 255 lb (115.7 kg) Total Weight Gain  (-5.443 kg) Urinalysis: Urine Protein: Negative Urine Glucose: Negative  Fetal Status: Fetal Heart Rate (bpm): Present         General:  Alert, oriented and cooperative. Patient is in no acute distress.  Skin: Skin is warm and dry. No rash noted.   Cardiovascular: Normal heart rate noted  Respiratory: Normal respiratory effort, no problems with respiration noted  Abdomen: Soft, gravid, appropriate for gestational age. Pain/Pressure: Absent     Pelvic:  Cervical exam deferred        Extremities: Normal range of motion.  Edema: None  Mental Status: Normal mood and affect. Normal behavior. Normal judgment and thought content.     Assessment   31 y.o. [id number] at 2332w6d, EDD 10/25/2017 by Last Menstrual Period presenting for routine prenatal visit.  Plan   Pregnancy # 4 Problems (from 03/02/17 to  present)    Problem Noted Resolved   Supervision of other normal pregnancy, antepartum 03/02/2017 by Oswaldo ConroySchmid, Jacelyn Y, CNM No   Overview Signed 03/02/2017  1:57 PM by Oswaldo ConroySchmid, Jacelyn Y, CNM    [facility] Prenatal Labs  Dating  Blood type:     Genetic Screen 1 Screen:    AFP:     Quad:     NIPS: Antibody:   Anatomic US  Rubella:   Varicella:    GTT Early:               Third trimester:  RPR:     Rhogam  HBsAg:     TDaP vaccine                       Flu Shot: HIV:     Baby Food                                GBS:   Contraception  Pap:  CBB     CS/VBAC    Support Person               Ultrasound today showed single IUP, size=GA with cardiac activity (FHR 132).    Has been experiencing nausea and vomiting since Saturday. Difficult to keep anything down. Discussed supportive measures, Unisom and B6, and prescribed Diclegis. Note given for more frequent breaks at work.   Preterm labor symptoms and general obstetric precautions including but not limited to vaginal bleeding and uterine cramping were reviewed in detail with the patient.  Please refer to After Visit Summary for other counseling recommendations.   Return in about 4 weeks (around 04/04/2017) for ROB.  Marcelyn BruinsJacelyn Schmid, CNM  03/07/2017  12:37 PM

## 2017-03-07 NOTE — Progress Notes (Signed)
C/o morning sickness.rj 

## 2017-03-08 LAB — RPR+RH+ABO+RUB AB+AB SCR+CB...
ANTIBODY SCREEN: NEGATIVE
HEMATOCRIT: 41.1 % (ref 34.0–46.6)
HIV Screen 4th Generation wRfx: NONREACTIVE
Hemoglobin: 13.2 g/dL (ref 11.1–15.9)
Hepatitis B Surface Ag: NEGATIVE
MCH: 29.1 pg (ref 26.6–33.0)
MCHC: 32.1 g/dL (ref 31.5–35.7)
MCV: 91 fL (ref 79–97)
PLATELETS: 324 10*3/uL (ref 150–379)
RBC: 4.54 x10E6/uL (ref 3.77–5.28)
RDW: 13.8 % (ref 12.3–15.4)
RPR Ser Ql: NONREACTIVE
Rh Factor: NEGATIVE
Rubella Antibodies, IGG: 1.4 index (ref >0.99)
Varicella zoster IgG: 736 index (ref >165)
WBC: 7.2 10*3/uL (ref 3.4–10.8)

## 2017-03-08 LAB — GLUCOSE, 1 HOUR GESTATIONAL: Gestational Diabetes Screen: 110 mg/dL (ref 65–139)

## 2017-03-09 ENCOUNTER — Encounter: Payer: Self-pay | Admitting: Advanced Practice Midwife

## 2017-03-22 ENCOUNTER — Telehealth: Payer: Self-pay

## 2017-03-22 ENCOUNTER — Other Ambulatory Visit: Payer: Self-pay | Admitting: Maternal Newborn

## 2017-03-22 DIAGNOSIS — O219 Vomiting of pregnancy, unspecified: Secondary | ICD-10-CM

## 2017-03-22 MED ORDER — ONDANSETRON 4 MG PO TBDP: 4.0000 mg | ORAL_TABLET | Freq: Four times a day (QID) | ORAL | 2 refills | Status: DC | PRN

## 2017-03-22 NOTE — Telephone Encounter (Signed)
Pt calling to inquire about new/different med for nausea as meds aren't working any longer. She is unable to keep anything down even water. She states it's twice as bad as before. [id number]#[phone]

## 2017-03-22 NOTE — Progress Notes (Signed)
Patient requested a different antiemetic as her nausea and vomiting have worsened. Ordered Zofran.  Marcelyn BruinsJacelyn Teaghan Formica, CNM 03/22/2017  3:46 PM

## 2017-03-23 NOTE — Telephone Encounter (Signed)
JS rx'd zofran b/c diclegis wasn't working.

## 2017-04-02 NOTE — Telephone Encounter (Signed)
Pt is calling to follow up on her nausea medication. Pt reports not feeling well thinks it the flu. Please call and advise patient

## 2017-04-02 NOTE — Telephone Encounter (Signed)
LVM for patient to call back.  Did she pick up the Zofran from it being sent in on 12/20? If she think she has the flu she needs to go to urgent care/PCP

## 2017-04-03 NOTE — L&D Delivery Note (Addendum)
Delivery Note At 7:39 PM a viable female was delivered via VBAC, Spontaneous (Presentation:ROA).  APGAR: 7, 8; weight 7#13.9oz .   Placenta status:spontaneous, intact.  Cord: 3VC complex nuchal x 2.  Mild uterine atony (polyhydramnios) treated with 800mcg of Cytotec rectally.  Cord pH: N/A  Anesthesia: Epidural  Episiotomy:  none Lacerations:  2nd degree Suture Repair: 3-0 monocryl and 2-0 Vicryl Est. Blood Loss (mL):  500mL  Mom to postpartum.  Baby to Couplet care / Skin to Skin.  Vena Austriandreas Staebler 10/09/2017, 8:17 PM  Farrel Connersolleen Farrah Skoda, CNM

## 2017-04-04 ENCOUNTER — Encounter: Payer: Self-pay | Admitting: Certified Nurse Midwife

## 2017-04-04 ENCOUNTER — Ambulatory Visit (INDEPENDENT_AMBULATORY_CARE_PROVIDER_SITE_OTHER): Payer: Medicaid Other | Admitting: Certified Nurse Midwife

## 2017-04-04 VITALS — BP 110/70 | Temp 97.5°F | Wt 224.0 lb

## 2017-04-04 DIAGNOSIS — J069 Acute upper respiratory infection, unspecified: Secondary | ICD-10-CM

## 2017-04-04 DIAGNOSIS — O09899 Supervision of other high risk pregnancies, unspecified trimester: Secondary | ICD-10-CM | POA: Insufficient documentation

## 2017-04-04 DIAGNOSIS — O219 Vomiting of pregnancy, unspecified: Secondary | ICD-10-CM

## 2017-04-04 DIAGNOSIS — O09219 Supervision of pregnancy with history of pre-term labor, unspecified trimester: Secondary | ICD-10-CM

## 2017-04-04 DIAGNOSIS — O0990 Supervision of high risk pregnancy, unspecified, unspecified trimester: Secondary | ICD-10-CM

## 2017-04-04 MED ORDER — ONDANSETRON 4 MG PO TBDP: 4.0000 mg | ORAL_TABLET | Freq: Four times a day (QID) | ORAL | 2 refills | Status: DC | PRN

## 2017-04-04 NOTE — Progress Notes (Signed)
Pt c/o daily nausea with some vomiting as well, worsens with driving and motion sickness. Reports 30# weight loss. Also c/o cold/flu sxs. Tried to schedule an appt but was told to go to PCP or urgent care and they told her to see OB.

## 2017-04-04 NOTE — Progress Notes (Signed)
HROB visit at 10wk6days: The following problems were addressed: 1)hyperemesis -has lost 19# over the last month. Zofran was called in-but not picked up. Diclegis was not helping. RX for Zofran 4 mgm was called into [facility] ([facility] would not fill). Trace ketonuria. Urine is tea colored 2) On Sunday (4 days ago) ran temperature of 103 and had N/V, cough and congestion. SUI with coughing. Was not sure what OTC meds she could use. Felling better-no further fever, but continue to cough and have congestion with ears popping.  On EXam: Nares: inflamed turbinates Neck: no cervical LAN; OP: no inflammation, +PND Lungs transient wheezing in right upper lobe; Left lung CTA Discussed OTC meds that are safe: antihistimines, cough medicine ?flu. Now 4 days out and feeling better-probably Tamiflu would not help 3) History of PPROM at 33 weeks with G2. Was on 17 P with G3 and delivered at term (38 weeks) Discussed recommendations to be on 17 P again this pregnancy and she agrees. Will send RX to specialty pharmacy 4) History of CS for failure to descend with G3. Leaning toward repeat CS if goes to term 5) unable to hear FHTs with DT. Able to see FCA on abdominal ultrasound as well as fetal movement  ROB in 1-2 weeks to follow up nausea and vomiting, URI/?flu like symptoms.

## 2017-04-05 ENCOUNTER — Telehealth: Payer: Self-pay | Admitting: Certified Nurse Midwife

## 2017-04-05 ENCOUNTER — Emergency Department
Admission: EM | Admit: 2017-04-05 | Discharge: 2017-04-05 | Disposition: A | Discharge status: Home / Self Care | Payer: Medicaid Other | Attending: Emergency Medicine | Admitting: Emergency Medicine

## 2017-04-05 ENCOUNTER — Emergency Department: Payer: Medicaid Other

## 2017-04-05 ENCOUNTER — Other Ambulatory Visit: Payer: Self-pay

## 2017-04-05 DIAGNOSIS — O209 Hemorrhage in early pregnancy, unspecified: Secondary | ICD-10-CM | POA: Insufficient documentation

## 2017-04-05 DIAGNOSIS — Z3A11 11 weeks gestation of pregnancy: Secondary | ICD-10-CM | POA: Diagnosis not present

## 2017-04-05 DIAGNOSIS — Z23 Encounter for immunization: Secondary | ICD-10-CM | POA: Insufficient documentation

## 2017-04-05 DIAGNOSIS — O4690 Antepartum hemorrhage, unspecified, unspecified trimester: Secondary | ICD-10-CM

## 2017-04-05 DIAGNOSIS — O360191 Maternal care for anti-D [Rh] antibodies, unspecified trimester, fetus 1: Secondary | ICD-10-CM | POA: Insufficient documentation

## 2017-04-05 DIAGNOSIS — Z87891 Personal history of nicotine dependence: Secondary | ICD-10-CM | POA: Insufficient documentation

## 2017-04-05 LAB — CBC
HEMATOCRIT: 42.5 % (ref 35.0–47.0)
HEMOGLOBIN: 14.4 g/dL (ref 12.0–16.0)
MCH: 30.1 pg (ref 26.0–34.0)
MCHC: 33.9 g/dL (ref 32.0–36.0)
MCV: 88.7 fL (ref 80.0–100.0)
Platelets: 253 10*3/uL (ref 150–440)
RBC: 4.79 MIL/uL (ref 3.80–5.20)
RDW: 13.6 % (ref 11.5–14.5)
WBC: 5.7 10*3/uL (ref 3.6–11.0)

## 2017-04-05 LAB — URINALYSIS, COMPLETE (UACMP) WITH MICROSCOPIC
Bilirubin Urine: NEGATIVE
GLUCOSE, UA: NEGATIVE mg/dL
Ketones, ur: 20 mg/dL — AB
Leukocytes, UA: NEGATIVE
Nitrite: NEGATIVE
PH: 5 (ref 5.0–8.0)
Protein, ur: 30 mg/dL — AB
SPECIFIC GRAVITY, URINE: 1.024 (ref 1.005–1.030)

## 2017-04-05 LAB — COMPREHENSIVE METABOLIC PANEL
ALK PHOS: 67 U/L (ref 38–126)
ALT: 49 U/L (ref 14–54)
AST: 42 U/L — AB (ref 15–41)
Albumin: 3.7 g/dL (ref 3.5–5.0)
Anion gap: 9 (ref 5–15)
BILIRUBIN TOTAL: 0.5 mg/dL (ref 0.3–1.2)
CALCIUM: 8.9 mg/dL (ref 8.9–10.3)
CO2: 28 mmol/L (ref 22–32)
CREATININE: 0.52 mg/dL (ref 0.44–1.00)
Chloride: 100 mmol/L — ABNORMAL LOW (ref 101–111)
GFR calc Af Amer: >60 mL/min (ref >60)
Glucose, Bld: 111 mg/dL — ABNORMAL HIGH (ref 65–99)
Potassium: 3 mmol/L — ABNORMAL LOW (ref 3.5–5.1)
Sodium: 137 mmol/L (ref 135–145)
TOTAL PROTEIN: 7.3 g/dL (ref 6.5–8.1)

## 2017-04-05 LAB — PREGNANCY, URINE: PREG TEST UR: POSITIVE — AB

## 2017-04-05 LAB — ANTIBODY SCREEN: Antibody Screen: NEGATIVE

## 2017-04-05 LAB — HCG, QUANTITATIVE, PREGNANCY: hCG, Beta Chain, Quant, S: 214964 m[IU]/mL — ABNORMAL HIGH (ref <5)

## 2017-04-05 MED ORDER — POTASSIUM CHLORIDE CRYS ER 20 MEQ PO TBCR
40.0000 meq | EXTENDED_RELEASE_TABLET | Freq: Once | ORAL | Status: AC
  Administered 2017-04-05: 40 meq via ORAL
  Filled 2017-04-05: qty 2

## 2017-04-05 MED ORDER — RHO D IMMUNE GLOBULIN 1500 UNIT/2ML IJ SOSY
300.0000 ug | PREFILLED_SYRINGE | Freq: Once | INTRAMUSCULAR | Status: AC
  Administered 2017-04-05: 300 ug via INTRAMUSCULAR
  Filled 2017-04-05: qty 2

## 2017-04-05 NOTE — Telephone Encounter (Signed)
Patient wanted to let Jill SideColleen know that she had some light bleeding yesterday, went to ER and given Rhogam, still having a little bit of spotting but no pain, was told just to let her provider here know.  She has appt next week 1/9 and wanted to make sure she didn't need to be seen sooner than scheduled appointment.

## 2017-04-05 NOTE — ED Provider Notes (Signed)
[facility] Emergency Department Provider Note   ____________________________________________   None    (approximate)  I have reviewed the triage vital signs and the nursing notes.   HISTORY  Chief Complaint Vaginal Bleeding    HPI Jamie Weber is a 32 y.o. female presents for evaluation of vaginal bleeding  Patient reports she saw her OB/GYN yesterday for cold symptoms, she was evaluated and had an ultrasound performed yesterday that they noticed that was normal with regard to fetal heartbeat.  She is continued to have some slight cough cold congestion but reports it is getting much better, not short of breath, no productive cough and reports that she is feeling improvement with regard to her cold and cough symptoms.  No fevers.  This morning when she got up she felt a small amount of blood with wiping and a small amount in her pajamas.  She reports is very some minimal amount of slightly reddish to stringy blood coming from the vagina.  No vaginal discharge or abnormal odor.  No pain or contractions.  Reports she has had similar with previous pregnancies.  Has had complications of prior pregnancies including some early deliveries and 1 miscarriage.   Past Medical History:  Diagnosis Date  . Allergy    history of hives controoled with zyrtec  . Depression     Patient Active Problem List   Diagnosis Date Noted  . History of preterm delivery, currently pregnant 04/04/2017  . Supervision of high risk pregnancy, antepartum 03/02/2017  . History of cesarean delivery 03/02/2017  . Encounter for preventive health examination 08/09/2013  . Generalized anxiety disorder 08/07/2012  . Sciatica of left side 03/03/2011  . Hormonal contraceptive 01/03/2011  . Pap smear for cervical cancer screening 01/03/2011  . Allergy   . History of migraine headaches     Past Surgical History:  Procedure Laterality Date  . DILATION AND CURETTAGE OF UTERUS  Jun 2011     normal,. Kincius  . LAPAROSCOPY  September 03, 2009   hysteroscopy and D&C, Dr. Harold HedgeKincius    Prior to Admission medications   Medication Sig Start Date End Date Taking? Authorizing Provider  Doxylamine-Pyridoxine (DICLEGIS) 10-10 MG TBEC Take 2 tablets by mouth at bedtime. If symptoms persist, add one tablet in the morning and one in the afternoon Patient not taking: Reported on 04/04/2017 03/07/17   Oswaldo ConroySchmid, Jacelyn Y, CNM  ondansetron (ZOFRAN ODT) 4 MG disintegrating tablet Take 1 tablet (4 mg total) by mouth every 6 (six) hours as needed for nausea. 04/04/17   Farrel ConnersGutierrez, Colleen, CNM    Allergies Patient has no known allergies.  Family History  Problem Relation Age of Onset  . Hypothyroidism Father   . Cancer Maternal Grandmother        bilateral breast ca  . Hyperlipidemia Paternal Grandmother   . Heart disease Paternal Grandfather     Social History Social History   Tobacco Use  . Smoking status: Former Smoker    Last attempt to quit: 10/03/2010    Years since quitting: 6.5  . Smokeless tobacco: Never Used  Substance Use Topics  . Alcohol use: Yes    Comment: occasional  . Drug use: No    Review of Systems Constitutional: No fever/chills for the last 2 days, but did have a fever couple days ago Eyes: No visual changes. ENT: No sore throat.  Some clear nasal discharge. Cardiovascular: Denies chest pain. Respiratory: Denies shortness of breath. Gastrointestinal: No abdominal pain.  No  nausea, no vomiting.  No diarrhea.  No constipation.   See HPI regarding gynecologic symptoms Genitourinary: Negative for dysuria. Musculoskeletal: Negative for back pain. Skin: Negative for rash. Neurological: Negative for headaches, focal weakness or numbness.  Some sinus congestion.    ____________________________________________   PHYSICAL EXAM:  VITAL SIGNS: ED Triage Vitals [04/05/17 0458]  Enc Vitals Group     BP (!) 134/91     Pulse Rate 74     Resp 20     Temp 98.2 F (36.8  C)     Temp Source Oral     SpO2 99 %     Weight 224 lb (101.6 kg)     Height 5\' 5"  (1.651 m)     Head Circumference      Peak Flow      Pain Score 0     Pain Loc      Pain Edu?      Excl. in GC?     Constitutional: Alert and oriented. Well appearing and in no acute distress.  She and her husband both very pleasant. Eyes: Conjunctivae are normal. Head: Atraumatic. Nose: No congestion/rhinnorhea. Mouth/Throat: Mucous membranes are moist. Neck: No stridor.   Cardiovascular: Normal rate, regular rhythm. Grossly normal heart sounds.  Good peripheral circulation. Respiratory: Normal respiratory effort.  No retractions. Lungs CTAB. Gastrointestinal: Soft and nontender. No distention. Patient requested not to have a vaginal examination, she does however request that she would like ultrasound report findings.  Discussed with her, this seems very reasonable.  She denies any infectious symptoms, reports only minimal amount of slight stringy vaginal bleeding.  She reports this is improving Musculoskeletal: No lower extremity tenderness nor edema. Neurologic:  Normal speech and language. No gross focal neurologic deficits are appreciated.  Skin:  Skin is warm, dry and intact. No rash noted. Psychiatric: Mood and affect are normal. Speech and behavior are normal.  ____________________________________________   LABS (all labs ordered are listed, but only abnormal results are displayed)  Labs Reviewed  COMPREHENSIVE METABOLIC PANEL - Abnormal; Notable for the following components:      Result Value   Potassium 3.0 (*)    Chloride 100 (*)    Glucose, Bld 111 (*)    BUN <5 (*)    AST 42 (*)    All other components within normal limits  URINALYSIS, COMPLETE (UACMP) WITH MICROSCOPIC - Abnormal; Notable for the following components:   Color, Urine AMBER (*)    APPearance HAZY (*)    Hgb urine dipstick LARGE (*)    Ketones, ur 20 (*)    Protein, ur 30 (*)    Bacteria, UA RARE (*)     Squamous Epithelial / LPF 0-5 (*)    All other components within normal limits  HCG, QUANTITATIVE, PREGNANCY - Abnormal; Notable for the following components:   hCG, Beta Chain, [place] Longest 214,964 (*)    All other components within normal limits  PREGNANCY, URINE - Abnormal; Notable for the following components:   Preg Test, Ur POSITIVE (*)    All other components within normal limits  CBC  ABO/RH  ANTIBODY SCREEN  RHOGAM INJECTION   ____________________________________________  EKG   ____________________________________________  RADIOLOGY  US Ob Comp Less 14 Wks  Result Date: 04/05/2017 CLINICAL DATA:  Vaginal bleeding. EXAM: OBSTETRIC <14 WK [place] AND TRANSVAGINAL OB US TECHNIQUE: Both transabdominal and transvaginal ultrasound examinations were performed for complete evaluation of the gestation as well as the maternal uterus, adnexal regions, and pelvic  cul-de-sac. Transvaginal technique was performed to assess early pregnancy. COMPARISON:  Ultrasound 08/01/2012. FINDINGS: Intrauterine gestational sac: Single Yolk sac:  Present Embryo:  Present Cardiac Activity: Present Heart Rate: 158  bpm CRL:  4.8 cm 11 w   4 d                  [place] EDC: 10/21/2017 Subchorionic hemorrhage:  None visualized. Maternal uterus/adnexae: 1.5 cm complex cyst left ovary, most likely corpus luteal cyst. IMPRESSION: 1.  Single viable intrauterine pregnancy at 11 weeks 4 days. 2.  Tiny complex cyst left ovary, most likely corpus luteal cyst. Electronically Signed   By: Maisie Fus  Register   On: 04/05/2017 07:09  Ultrasound reviewed, positive IUP.  ____________________________________________   PROCEDURES  Procedure(s) performed: None  Procedures  Critical Care performed: No  ____________________________________________   INITIAL IMPRESSION / ASSESSMENT AND PLAN / ED COURSE  Pertinent labs & imaging results that were available during my care of the patient were reviewed by me and considered in my medical  decision making (see chart for details).  Patient presents for evaluation of vaginal bleeding.  Recent cough and cold symptoms, reports these are improving and she appears well with regard to these, still some ongoing symptoms, but appears to be improving.  No evidence of increased work of breathing.  Reports painless vaginal bleeding during the first trimester.  No associated abdominal pain, stable hemodynamics and blood work.  Patient is noted to have a negative blood type, RhoGam is been ordered.  Discussed case and care with Dr. Bonney Aid of OB/GYN, he advises RhoGam be given, otherwise given the minimal bleeding and reassuring ultrasound he recommends close follow-up in the office.    RhoGam given.  ____________________________________________   FINAL CLINICAL IMPRESSION(S) / ED DIAGNOSES  Final diagnoses:  Vaginal bleeding in pregnancy      NEW MEDICATIONS STARTED DURING THIS VISIT:  This SmartLink is deprecated. Use AVSMEDLIST instead to display the medication list for a patient.   Note:  This document was prepared using Dragon voice recognition software and may include unintentional dictation errors.     Sharyn Creamer, MD 04/05/17 [phone]

## 2017-04-05 NOTE — ED Triage Notes (Signed)
Pt in with co vaginal bleeding pt is [redacted] weeks pregnant. States bleeding is not heavy at this time, P4,G2,A1.

## 2017-04-06 LAB — RHOGAM INJECTION: UNIT DIVISION: 0

## 2017-04-06 LAB — ABO/RH: ABO/RH(D): A NEG

## 2017-04-06 MED ORDER — HYDROXYPROGESTERONE CAPROATE 250 MG/ML IM OIL: 250.0000 mg | TOPICAL_OIL | Freq: Once | INTRAMUSCULAR | Status: DC

## 2017-04-06 NOTE — Addendum Note (Signed)
Addended by: Farrel ConnersGUTIERREZ, Jamail Cullers on: 04/06/2017 04:59 PM   Modules accepted: Orders

## 2017-04-11 ENCOUNTER — Ambulatory Visit (INDEPENDENT_AMBULATORY_CARE_PROVIDER_SITE_OTHER): Payer: Medicaid Other | Admitting: Advanced Practice Midwife

## 2017-04-11 ENCOUNTER — Encounter: Payer: Self-pay | Admitting: Advanced Practice Midwife

## 2017-04-11 VITALS — BP 118/70 | Wt 229.0 lb

## 2017-04-11 DIAGNOSIS — Z3A11 11 weeks gestation of pregnancy: Secondary | ICD-10-CM

## 2017-04-11 DIAGNOSIS — Z23 Encounter for immunization: Secondary | ICD-10-CM | POA: Diagnosis not present

## 2017-04-11 NOTE — Progress Notes (Signed)
Pt went to ER last week for some spotting

## 2017-04-11 NOTE — Patient Instructions (Signed)

## 2017-04-11 NOTE — Progress Notes (Signed)
Routine Prenatal Care Visit  Subjective  Jamie Weber is a 32 y.o. [id number] at 8244w6d being seen today for ongoing prenatal care.  She is currently monitored for the following issues for this high-risk pregnancy and has Hormonal contraceptive; Allergy; History of migraine headaches; Pap smear for cervical cancer screening; Sciatica of left side; Generalized anxiety disorder; Encounter for preventive health examination; Supervision of high risk pregnancy, antepartum; History of cesarean delivery; and History of preterm delivery, currently pregnant on their problem list.  ----------------------------------------------------------------------------------- Patient reports still having nausea and taking zofran with good relief. She had a couple days of spotting last week and went to ER. U/S was normal at that visit. Spotting was only with wiping and then resolved. She had spotting again following intercourse but only briefly. .    Lockie Pares. Vag. Bleeding: None.   . Denies leaking of fluid.  ----------------------------------------------------------------------------------- The following portions of the patient's history were reviewed and updated as appropriate: allergies, current medications, past family history, past medical history, past social history, past surgical history and problem list. Problem list updated.   Objective  Blood pressure 118/70, weight 229 lb (103.9 kg), last menstrual period 01/18/2017. Pregravid weight 255 lb (115.7 kg) Total Weight Gain  (-11.794 kg) Urinalysis:      Fetal Status:         Fetal heart tones: 153  General:  Alert, oriented and cooperative. Patient is in no acute distress.  Skin: Skin is warm and dry. No rash noted.   Cardiovascular: Normal heart rate noted  Respiratory: Normal respiratory effort, no problems with respiration noted  Abdomen: Soft, gravid, appropriate for gestational age. Pain/Pressure: Absent     Pelvic:  Cervical exam deferred          Extremities: Normal range of motion.     Mental Status: Normal mood and affect. Normal behavior. Normal judgment and thought content.   Assessment   32 y.o. [id number] at 8444w6d by  10/25/2017, by Last Menstrual Period presenting for routine prenatal visit  Plan   Pregnancy # 4 Problems (from 03/02/17 to present)    Problem Noted Resolved   Supervision of high risk pregnancy, antepartum 03/02/2017 by Oswaldo ConroySchmid, Jacelyn Y, CNM No   Overview Signed 03/02/2017  1:57 PM by Oswaldo ConroySchmid, Jacelyn Y, CNM    Clinic [facility] Prenatal Labs  Dating  Blood type:     Genetic Screen 1 Screen:    AFP:     Quad:     NIPS: Antibody:   Anatomic US  Rubella:   Varicella:    GTT Early:               Third trimester:  RPR:     Rhogam  HBsAg:     TDaP vaccine    Flu Shot: 04/11/17 HIV:     Baby Food                                GBS:   Contraception BTL- 30 day paper [ ]  Pap:  CBB     CS/VBAC C/S 5 years ago. Desires VBAC   Support Person                  Preterm labor symptoms and general obstetric precautions including but not limited to vaginal bleeding, contractions, leaking of fluid and fetal movement were reviewed in detail with the patient. Please refer to After Visit Summary for other counseling recommendations.  Start  17 P at 16 weeks  Return in about 4 weeks (around 05/09/2017) for rob.  Tresea Mall, CNM  04/11/2017 11:20 AM

## 2017-05-08 ENCOUNTER — Encounter: Payer: Self-pay | Admitting: Certified Nurse Midwife

## 2017-05-08 ENCOUNTER — Ambulatory Visit (INDEPENDENT_AMBULATORY_CARE_PROVIDER_SITE_OTHER): Payer: Medicaid Other | Admitting: Certified Nurse Midwife

## 2017-05-08 VITALS — BP 120/70 | Wt 225.0 lb

## 2017-05-08 DIAGNOSIS — O09219 Supervision of pregnancy with history of pre-term labor, unspecified trimester: Secondary | ICD-10-CM

## 2017-05-08 DIAGNOSIS — Z98891 History of uterine scar from previous surgery: Secondary | ICD-10-CM

## 2017-05-08 DIAGNOSIS — O09899 Supervision of other high risk pregnancies, unspecified trimester: Secondary | ICD-10-CM

## 2017-05-08 DIAGNOSIS — O0990 Supervision of high risk pregnancy, unspecified, unspecified trimester: Secondary | ICD-10-CM

## 2017-05-08 DIAGNOSIS — Z3A15 15 weeks gestation of pregnancy: Secondary | ICD-10-CM

## 2017-05-08 MED ORDER — HYDROXYPROGESTERONE CAPROATE 250 MG/ML IM OIL: TOPICAL_OIL | INTRAMUSCULAR | 5 refills | Status: DC

## 2017-05-08 MED ORDER — HYDROXYPROGESTERONE CAPROATE 250 MG/ML IM OIL: 250.0000 mg | TOPICAL_OIL | Freq: Once | INTRAMUSCULAR | 0 refills | Status: DC

## 2017-05-08 NOTE — Progress Notes (Signed)
HROB at 15wk 5 days for ROB. Hx of preterm delivery at 33 weeks with G2, CS with G3 at term (was on 17 P injections). Still having some days of nausea and vomiting, other days without nausea and vomiting. Has lost 5# over last 4 weeks.  Complains of episodes of feeling lightheaded, followed by nausea and palpitations and tingling in fingers. Usually happens when she is standing (just returned to work) or bending over. DIscussed etiology behind these vasovagal episodes, probably worsened by being a little dehydrated and hypoglycemic Recommend "pedaling feet" when sitting or standing in one spot, drinking more fluids when possible,  and increasing intake of protein and fat in diet to keep blood sugar up. Feeling ?flutters at times. FHT 143/ FM heard with DT/ FH at U-3FB Declines genetic testing [facility] for injections and in 4 weeks for anatomy scan and ROB.  Farrel Connersolleen Leyan Branden, CNM

## 2017-05-08 NOTE — Progress Notes (Signed)
Pt c/o feeling dizzy and lightheaded for the past few days. Still having nausea with relief from zofran.

## 2017-05-09 ENCOUNTER — Encounter: Payer: Medicaid Other | Admitting: Certified Nurse Midwife

## 2017-05-11 ENCOUNTER — Ambulatory Visit (INDEPENDENT_AMBULATORY_CARE_PROVIDER_SITE_OTHER): Payer: Medicaid Other | Admitting: Obstetrics and Gynecology

## 2017-05-11 VITALS — BP 112/62 | Wt 222.0 lb

## 2017-05-11 DIAGNOSIS — O09212 Supervision of pregnancy with history of pre-term labor, second trimester: Secondary | ICD-10-CM

## 2017-05-11 DIAGNOSIS — R42 Dizziness and giddiness: Secondary | ICD-10-CM

## 2017-05-11 DIAGNOSIS — Z3A16 16 weeks gestation of pregnancy: Secondary | ICD-10-CM | POA: Diagnosis not present

## 2017-05-11 DIAGNOSIS — O0990 Supervision of high risk pregnancy, unspecified, unspecified trimester: Secondary | ICD-10-CM

## 2017-05-11 DIAGNOSIS — O09892 Supervision of other high risk pregnancies, second trimester: Secondary | ICD-10-CM

## 2017-05-11 DIAGNOSIS — H538 Other visual disturbances: Secondary | ICD-10-CM

## 2017-05-11 DIAGNOSIS — H539 Unspecified visual disturbance: Secondary | ICD-10-CM

## 2017-05-11 MED ORDER — HYDROXYPROGESTERONE CAPROATE 250 MG/ML IM OIL
250.0000 mg | TOPICAL_OIL | Freq: Once | INTRAMUSCULAR | Status: AC
  Administered 2017-05-11: 250 mg via INTRAMUSCULAR

## 2017-05-11 NOTE — Progress Notes (Signed)
Routine Prenatal Care Visit  Subjective  Jamie Weber is a 32 y.o. [id number] at [redacted]w[redacted]d being seen today for ongoing prenatal care.  She is currently monitored for the following issues for this high-risk pregnancy and has Hormonal contraceptive; Allergy; History of migraine headaches; Pap smear for cervical cancer screening; Sciatica of left side; Generalized anxiety disorder; Encounter for preventive health examination; Supervision of high risk pregnancy, antepartum; History of cesarean delivery; and History of preterm delivery, currently pregnant on their problem list.  ----------------------------------------------------------------------------------- Pt c/o episodes of feeling light headed, blurred vision, dropping things, afraid to drive, feet going numb, touch sensitivity, swelling all over.   20lb weight loss  Orthostatic Blood pressures:  Lying: 108/68 HR 94 Sitting 110/70 HR 86 Standing 118/68 HR 95  Random blood glucose 94 Contractions: Not present. Vag. Bleeding: None.   . Denies leaking of fluid.  ----------------------------------------------------------------------------------- The following portions of the patient's history were reviewed and updated as appropriate: allergies, current medications, past family history, past medical history, past social history, past surgical history and problem list. Problem list updated.   Objective  Blood pressure 112/62, weight 222 lb (100.7 kg), last menstrual period 01/18/2017. Pregravid weight 255 lb (115.7 kg) Total Weight Gain  (-14.969 kg) Urinalysis:      Fetal Status: Fetal Heart Rate (bpm): 134         General:  Alert, oriented and cooperative. Patient is in no acute distress.  Skin: Skin is warm and dry. No rash noted.   Cardiovascular: Normal heart rate noted  Respiratory: Normal respiratory effort, no problems with respiration noted  Abdomen: Soft, gravid, appropriate for gestational age. Pain/Pressure: Absent      Pelvic:  Cervical exam deferred        Extremities: Normal range of motion.     ental Status: Normal mood and affect. Normal behavior. Normal judgment and thought content.     Assessment   32 y.o. [id number] at [redacted]w[redacted]d by  10/25/2017, by Last Menstrual Period presenting for routine prenatal visit  Plan   Pregnancy # 4 Problems (from 03/02/17 to present)    Problem Noted Resolved   Supervision of high risk pregnancy, antepartum 03/02/2017 by Oswaldo Conroy, CNM No   Overview Addendum 05/18/2017  4:48 PM by Natale Milch, MD    [facility]inic [facility] Prenatal Labs  Dating  Blood type: --/--/A NEG  (01/03 0457)   Genetic Screen 1 Screen:    AFP:     Quad:     NIPS: Antibody:NEG (01/03 0457)  Anatomic [place]  Rubella: 1.40 (12/05 1126)  Varicella:  Immune  GTT Early:               Third trimester:  RPR: Non Reactive (12/05 1126)   Rhogam  HBsAg: Negative (12/05 1126)   TDaP vaccine                        Flu Shot: 04/11/17 HIV: Non Reactive (12/05 1126)   Baby Food                                GBS:   Contraception  Pap: 2016 NIL, will need postpartum  CBB     CS/VBAC    Support Person                  Gestational age appropriate obstetric precautions including but not limited  to vaginal bleeding, contractions, leaking of fluid and fetal movement were reviewed in detail with the patient.    Will check TSH Told patient she should not drive until her symptoms are improved.  Makena injection today  Return in about 1 week (around 05/18/2017) for ROB and injection.  Adelene Idlerhristanna Schuman MD [facility], [facility] 05/20/2017, 5:54 PM

## 2017-05-12 LAB — TSH+FREE T4
Free T4: 0.95 ng/dL (ref 0.82–1.77)
TSH: 1.69 u[IU]/mL (ref 0.450–4.500)

## 2017-05-14 NOTE — Progress Notes (Signed)
Called, no answer, mailbox full could not leave voicemail.

## 2017-05-15 ENCOUNTER — Ambulatory Visit: Payer: Medicaid Other

## 2017-05-18 ENCOUNTER — Ambulatory Visit (INDEPENDENT_AMBULATORY_CARE_PROVIDER_SITE_OTHER): Payer: Medicaid Other | Admitting: Obstetrics and Gynecology

## 2017-05-18 VITALS — BP 118/70 | Wt 224.0 lb

## 2017-05-18 DIAGNOSIS — F411 Generalized anxiety disorder: Secondary | ICD-10-CM

## 2017-05-18 DIAGNOSIS — O09219 Supervision of pregnancy with history of pre-term labor, unspecified trimester: Secondary | ICD-10-CM | POA: Diagnosis not present

## 2017-05-18 DIAGNOSIS — G43B0 Ophthalmoplegic migraine, not intractable: Secondary | ICD-10-CM

## 2017-05-18 DIAGNOSIS — O0990 Supervision of high risk pregnancy, unspecified, unspecified trimester: Secondary | ICD-10-CM

## 2017-05-18 DIAGNOSIS — R55 Syncope and collapse: Secondary | ICD-10-CM

## 2017-05-18 DIAGNOSIS — Z8751 Personal history of pre-term labor: Secondary | ICD-10-CM | POA: Diagnosis not present

## 2017-05-18 DIAGNOSIS — O09899 Supervision of other high risk pregnancies, unspecified trimester: Secondary | ICD-10-CM

## 2017-05-18 DIAGNOSIS — Z3A17 17 weeks gestation of pregnancy: Secondary | ICD-10-CM

## 2017-05-18 DIAGNOSIS — H547 Unspecified visual loss: Secondary | ICD-10-CM

## 2017-05-18 MED ORDER — HYDROXYPROGESTERONE CAPROATE 250 MG/ML IM OIL
250.0000 mg | TOPICAL_OIL | Freq: Once | INTRAMUSCULAR | Status: AC
  Administered 2017-05-18: 250 mg via INTRAMUSCULAR

## 2017-05-18 NOTE — Progress Notes (Signed)
Routine Prenatal Care Visit  Subjective  Jamie Weber is a 32 y.o. [id number] at [redacted]w[redacted]d being seen today for ongoing prenatal care.  She is currently monitored for the following issues for this high-risk pregnancy and has Hormonal contraceptive; Allergy; History of migraine headaches; Pap smear for cervical cancer screening; Sciatica of left side; Generalized anxiety disorder; Encounter for preventive health examination; Supervision of high risk pregnancy, antepartum; History of cesarean delivery; and History of preterm delivery, currently pregnant on their problem list.  ----------------------------------------------------------------------------------- 17P today. Patient reports symptoms have continued of feeling weak at work and vision changes where she sees a curtain coming down. Temporary vision loss. Patient has not been driving because of this symptom.  Patient does have a history of migraines, but she reports that her migraines have improved during this pregnancy. Patient says that she has just excepted this issues as a normal part of her pregnancy because they are frequent and people she works with have come to expect and anticipate them.  Contractions: Not present. Vag. Bleeding: None.  Movement: Present. Denies leaking of fluid.  ----------------------------------------------------------------------------------- The following portions of the patient's history were reviewed and updated as appropriate: allergies, current medications, past family history, past medical history, past social history, past surgical history and problem list. Problem list updated.   Objective  Blood pressure 118/70, weight 224 lb (101.6 kg), last menstrual period 01/18/2017. Pregravid weight 255 lb (115.7 kg) Total Weight Gain  (-14.062 kg) Urinalysis: Urine Protein: 1+ Urine Glucose: Negative  Fetal Status: Fetal Heart Rate (bpm): 137   Movement: Present     General:  Alert, oriented and cooperative.  Patient is in no acute distress.  Skin: Skin is warm and dry. No rash noted.   Cardiovascular: Normal heart rate noted  Respiratory: Normal respiratory effort, no problems with respiration noted  Abdomen: Soft, gravid, appropriate for gestational age. Pain/Pressure: Absent     Pelvic:  Cervical exam deferred        Extremities: Normal range of motion.     ental Status: Normal mood and affect. Normal behavior. Normal judgment and thought content.     Assessment   32 y.o. [id number] at [redacted]w[redacted]d by  10/25/2017, by Last Menstrual Period presenting for routine prenatal visit  Plan   Pregnancy # 4 Problems (from 03/02/17 to present)    Problem Noted Resolved   Supervision of high risk pregnancy, antepartum 03/02/2017 by Oswaldo Conroy, CNM No   Overview Addendum 05/18/2017  4:48 PM by Natale Milch, MD    Clinic [facility] Prenatal Labs  Dating  Blood type: --/--/A NEG  (01/03 0457)   Genetic Screen 1 Screen:    AFP:     Quad:     NIPS: Antibody:NEG (01/03 0457)  Anatomic [place]  Rubella: 1.40 (12/05 1126)  Varicella:  Immune  GTT Early:               Third trimester:  RPR: Non Reactive (12/05 1126)   Rhogam  HBsAg: Negative (12/05 1126)   TDaP vaccine                        Flu Shot: 04/11/17 HIV: Non Reactive (12/05 1126)   Baby Food                                GBS:   Contraception  Pap: 2016 NIL, will need postpartum  CBB     CS/VBAC    Support Person                  Gestational age appropriate obstetric precautions including but not limited to vaginal bleeding, contractions, leaking of fluid and fetal movement were reviewed in detail with the patient.    Will check electrolytes, ACTH, prolactin. Will refer patient to opthalmology and neurology for their input on the patient's temporary loss of vision and other complaints. Return in about 1 week (around 05/25/2017) for as scheduled injection.  Adelene Idlerhristanna Zaven Klemens MD [facility], [facility] 05/20/2017,  1:39 PM

## 2017-05-21 ENCOUNTER — Other Ambulatory Visit: Payer: Medicaid Other

## 2017-05-21 ENCOUNTER — Encounter: Payer: Self-pay | Admitting: Neurology

## 2017-05-22 ENCOUNTER — Other Ambulatory Visit: Payer: Self-pay | Admitting: Obstetrics and Gynecology

## 2017-05-22 ENCOUNTER — Encounter: Payer: Self-pay | Admitting: Obstetrics and Gynecology

## 2017-05-22 ENCOUNTER — Ambulatory Visit (INDEPENDENT_AMBULATORY_CARE_PROVIDER_SITE_OTHER): Payer: Medicaid Other | Admitting: Obstetrics and Gynecology

## 2017-05-22 ENCOUNTER — Other Ambulatory Visit: Payer: Medicaid Other

## 2017-05-22 ENCOUNTER — Ambulatory Visit (INDEPENDENT_AMBULATORY_CARE_PROVIDER_SITE_OTHER): Payer: Medicaid Other

## 2017-05-22 ENCOUNTER — Ambulatory Visit: Payer: Medicaid Other

## 2017-05-22 VITALS — BP 120/74 | Wt 229.0 lb

## 2017-05-22 DIAGNOSIS — O0990 Supervision of high risk pregnancy, unspecified, unspecified trimester: Secondary | ICD-10-CM | POA: Diagnosis not present

## 2017-05-22 DIAGNOSIS — O09219 Supervision of pregnancy with history of pre-term labor, unspecified trimester: Secondary | ICD-10-CM | POA: Diagnosis not present

## 2017-05-22 DIAGNOSIS — Z3A17 17 weeks gestation of pregnancy: Secondary | ICD-10-CM

## 2017-05-22 DIAGNOSIS — Z98891 History of uterine scar from previous surgery: Secondary | ICD-10-CM

## 2017-05-22 DIAGNOSIS — R109 Unspecified abdominal pain: Secondary | ICD-10-CM

## 2017-05-22 DIAGNOSIS — O26899 Other specified pregnancy related conditions, unspecified trimester: Secondary | ICD-10-CM | POA: Diagnosis not present

## 2017-05-22 DIAGNOSIS — O09899 Supervision of other high risk pregnancies, unspecified trimester: Secondary | ICD-10-CM

## 2017-05-22 LAB — POCT URINALYSIS DIPSTICK
Bilirubin, UA: NEGATIVE
Glucose, UA: NEGATIVE
KETONES UA: POSITIVE
Leukocytes, UA: NEGATIVE
Nitrite, UA: NEGATIVE
PH UA: 8 (ref 5.0–8.0)
Protein, UA: NEGATIVE
RBC UA: NEGATIVE
SPEC GRAV UA: 1.025 (ref 1.010–1.025)
UROBILINOGEN UA: NEGATIVE U/dL — AB

## 2017-05-22 NOTE — Progress Notes (Signed)
Routine Prenatal Care Visit  Subjective  Jamie Weber is a 32 y.o. [id number] at 7515w2d being seen today for ongoing prenatal care.  She is currently monitored for the following issues for this high-risk pregnancy and has Hormonal contraceptive; Allergy; History of migraine headaches; Pap smear for cervical cancer screening; Sciatica of left side; Generalized anxiety disorder; Encounter for preventive health examination; Supervision of high risk pregnancy, antepartum; History of cesarean delivery; and History of preterm delivery, currently pregnant on their problem list.  ----------------------------------------------------------------------------------- Patient reports low pelvic/lower abdominal cramping.  Denies urinary symptoms, denies vaginal symptoms of itching, burning, irritation.  Denies vaginal bleeding.no alleviating or aggravating factors.  No other symptoms (no fevers, chills, diarrhea, constipation). .Contractions: Not present. Vag. Bleeding: None.  Movement: Present. Denies leaking of fluid.   Pelvic, transvaginal ultrasound shows cervical length of 4.6 cm and closed.   ----------------------------------------------------------------------------------- The following portions of the patient's history were reviewed and updated as appropriate: allergies, current medications, past family history, past medical history, past social history, past surgical history and problem list. Problem list updated.   Objective  Blood pressure 120/74, weight 229 lb (103.9 kg), last menstrual period 01/18/2017. Pregravid weight 255 lb (115.7 kg) Total Weight Gain  (-11.794 kg) Urinalysis:      Fetal Status: Fetal Heart Rate (bpm): 140   Movement: Present     General:  Alert, oriented and cooperative. Patient is in no acute distress.  Skin: Skin is warm and dry. No rash noted.   Cardiovascular: Normal heart rate noted  Respiratory: Normal respiratory effort, no problems with respiration noted   Abdomen: Soft, gravid, appropriate for gestational age. Pain/Pressure: Present     Pelvic:  Cervical exam performed Dilation: Closed Effacement (%): 20 Station: Ballotable  Extremities: Normal range of motion.     Mental Status: Normal mood and affect. Normal behavior. Normal judgment and thought content.   Wet Prep: PH: 4.5 Clue Cells: Negative Fungal elements: Negative Trichomonas: Negative  Assessment   32 y.o. [id number] at 5315w2d by  10/25/2017, by Last Menstrual Period presenting for work-in prenatal visit  Plan   Pregnancy # 4 Problems (from 03/02/17 to present)    Problem Noted Resolved   History of preterm delivery, currently pregnant 04/04/2017 by Farrel ConnersGutierrez, Colleen, CNM No   Overview Signed 05/08/2017  9:13 AM by Farrel ConnersGutierrez, Colleen, CNM    Preterm delivery at 33 weeks with G2      Supervision of high risk pregnancy, antepartum 03/02/2017 by Oswaldo ConroySchmid, Jacelyn Y, CNM No   Overview Addendum 05/18/2017  4:48 PM by Natale MilchSchuman, Christanna R, MD    Clinic [facility] Prenatal Labs  Dating  Blood type: --/--/A NEG  (01/03 0457)   Genetic Screen 1 Screen:    AFP:     Quad:     NIPS: Antibody:NEG (01/03 0457)  Anatomic US  Rubella: 1.40 (12/05 1126)  Varicella:  Immune  GTT Early:               Third trimester:  RPR: Non Reactive (12/05 1126)   Rhogam  HBsAg: Negative (12/05 1126)   TDaP vaccine                        Flu Shot: 04/11/17 HIV: Non Reactive (12/05 1126)   Baby Food                                GBS:  Contraception  Pap: 2016 NIL, will need postpartum  CBB     CS/VBAC    Support Person              History of cesarean delivery 03/02/2017 by Oswaldo Conroy, CNM No   Overview Signed 05/08/2017  9:13 AM by Farrel Conners, CNM    For failure to descend           Preterm labor symptoms and general obstetric precautions including but not limited to vaginal bleeding, contractions, leaking of fluid and fetal movement were reviewed in detail with the patient. Please  refer to After Visit Summary for other counseling recommendations.   - Urinalysis performed without evidence of infection, urine culture sent. - wet prep negative - cervical length u/s reassuring. - patient to keep next appointment for 17OHP - likely diagnosis is round ligament pain.  Patient reassured.  Return in about 1 week (around 05/29/2017) for Keep routine prenatal appointment.  Thomasene Mohair, MD  05/26/2017 11:06 AM   ADDENDUM: Urine culture negative

## 2017-05-23 ENCOUNTER — Other Ambulatory Visit: Payer: Self-pay | Admitting: Obstetrics and Gynecology

## 2017-05-23 DIAGNOSIS — E229 Hyperfunction of pituitary gland, unspecified: Principal | ICD-10-CM

## 2017-05-23 DIAGNOSIS — R7989 Other specified abnormal findings of blood chemistry: Secondary | ICD-10-CM

## 2017-05-23 LAB — PROLACTIN: Prolactin: 159 ng/mL — ABNORMAL HIGH (ref 4.8–23.3)

## 2017-05-23 LAB — ELECTROLYTE PANEL
CO2: 19 mmol/L — AB (ref 20–29)
Chloride: 103 mmol/L (ref 96–106)
POTASSIUM: 3.6 mmol/L (ref 3.5–5.2)
SODIUM: 139 mmol/L (ref 134–144)

## 2017-05-23 LAB — ACTH: ACTH: 44.8 pg/mL (ref 7.2–63.3)

## 2017-05-23 NOTE — Progress Notes (Signed)
Called and discussed with patient. Will order MRI to evaluate for macroadenoma.

## 2017-05-25 ENCOUNTER — Ambulatory Visit (INDEPENDENT_AMBULATORY_CARE_PROVIDER_SITE_OTHER): Payer: Medicaid Other

## 2017-05-25 DIAGNOSIS — O09219 Supervision of pregnancy with history of pre-term labor, unspecified trimester: Secondary | ICD-10-CM | POA: Diagnosis not present

## 2017-05-25 DIAGNOSIS — O09899 Supervision of other high risk pregnancies, unspecified trimester: Secondary | ICD-10-CM

## 2017-05-25 LAB — URINE CULTURE

## 2017-05-25 MED ORDER — HYDROXYPROGESTERONE CAPROATE 250 MG/ML IM OIL
250.0000 mg | TOPICAL_OIL | Freq: Once | INTRAMUSCULAR | Status: AC
  Administered 2017-05-25: 250 mg via INTRAMUSCULAR

## 2017-05-26 ENCOUNTER — Encounter: Payer: Self-pay | Admitting: Obstetrics and Gynecology

## 2017-05-26 LAB — POCT WET PREP WITH KOH
CLUE CELLS WET PREP PER HPF POC: NEGATIVE
KOH Prep POC: NEGATIVE
PH, VAGINAL: 4.5
TRICHOMONAS UA: NEGATIVE

## 2017-05-29 ENCOUNTER — Ambulatory Visit
Admission: RE | Admit: 2017-05-29 | Discharge: 2017-05-29 | Disposition: A | Discharge status: Home / Self Care | Payer: Medicaid Other | Source: Ambulatory Visit | Attending: Obstetrics and Gynecology | Admitting: Obstetrics and Gynecology

## 2017-05-29 ENCOUNTER — Ambulatory Visit: Payer: Medicaid Other

## 2017-05-29 DIAGNOSIS — E229 Hyperfunction of pituitary gland, unspecified: Secondary | ICD-10-CM | POA: Diagnosis not present

## 2017-05-29 DIAGNOSIS — R7989 Other specified abnormal findings of blood chemistry: Secondary | ICD-10-CM

## 2017-06-01 ENCOUNTER — Telehealth: Payer: Self-pay | Admitting: Obstetrics and Gynecology

## 2017-06-01 ENCOUNTER — Ambulatory Visit (INDEPENDENT_AMBULATORY_CARE_PROVIDER_SITE_OTHER): Payer: Medicaid Other

## 2017-06-01 DIAGNOSIS — Z3A19 19 weeks gestation of pregnancy: Secondary | ICD-10-CM

## 2017-06-01 DIAGNOSIS — O09892 Supervision of other high risk pregnancies, second trimester: Secondary | ICD-10-CM

## 2017-06-01 DIAGNOSIS — O09212 Supervision of pregnancy with history of pre-term labor, second trimester: Secondary | ICD-10-CM

## 2017-06-01 MED ORDER — HYDROXYPROGESTERONE CAPROATE 250 MG/ML IM OIL
250.0000 mg | TOPICAL_OIL | Freq: Once | INTRAMUSCULAR | Status: AC
  Administered 2017-06-01: 250 mg via INTRAMUSCULAR

## 2017-06-01 NOTE — Telephone Encounter (Signed)
Discussed MRI findings. No pituitary adenoma or growth. 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  Discussed we will continue to follow her along. She has a neurology appt in early May. If symptoms continue, may need to see if we can get her in sooner. All questions answered as well as I could at this time.  Thomasene MohairStephen Jackson, MD, Merlinda FrederickFACOG [facility], [facility] 06/01/2017 6:02 PM

## 2017-06-01 NOTE — Telephone Encounter (Signed)
Please have SDJ review. CS ordered MRI but out of the office and he was last to see pt. Thank you!

## 2017-06-01 NOTE — Telephone Encounter (Signed)
Please call patient at [phone] or husband Jill AlexandersJustin at [phone]

## 2017-06-01 NOTE — Telephone Encounter (Signed)
Pt came in to check on MRI results. Please advise

## 2017-06-05 ENCOUNTER — Other Ambulatory Visit: Payer: Medicaid Other

## 2017-06-05 ENCOUNTER — Encounter: Payer: Medicaid Other | Admitting: Advanced Practice Midwife

## 2017-06-05 NOTE — Telephone Encounter (Signed)
Thanks for letting me know and discussing with patient. I think if she continues to have issues postpartum she will need to have this MRI repeated since they did not use contrast. We'll see what neurology adds.  Adelene Idlerhristanna Esthela Brandner MD 06/05/17 3:11 PM

## 2017-06-08 ENCOUNTER — Ambulatory Visit (INDEPENDENT_AMBULATORY_CARE_PROVIDER_SITE_OTHER): Payer: Medicaid Other | Admitting: Obstetrics and Gynecology

## 2017-06-08 ENCOUNTER — Ambulatory Visit: Payer: Medicaid Other

## 2017-06-08 ENCOUNTER — Ambulatory Visit (INDEPENDENT_AMBULATORY_CARE_PROVIDER_SITE_OTHER): Payer: Medicaid Other

## 2017-06-08 VITALS — BP 128/60 | Wt 226.0 lb

## 2017-06-08 DIAGNOSIS — Z3A20 20 weeks gestation of pregnancy: Secondary | ICD-10-CM

## 2017-06-08 DIAGNOSIS — O0990 Supervision of high risk pregnancy, unspecified, unspecified trimester: Secondary | ICD-10-CM

## 2017-06-08 DIAGNOSIS — O09899 Supervision of other high risk pregnancies, unspecified trimester: Secondary | ICD-10-CM

## 2017-06-08 DIAGNOSIS — O09219 Supervision of pregnancy with history of pre-term labor, unspecified trimester: Secondary | ICD-10-CM

## 2017-06-08 DIAGNOSIS — Z98891 History of uterine scar from previous surgery: Secondary | ICD-10-CM | POA: Diagnosis not present

## 2017-06-08 MED ORDER — HYDROXYPROGESTERONE CAPROATE 250 MG/ML IM OIL
250.0000 mg | TOPICAL_OIL | Freq: Once | INTRAMUSCULAR | Status: AC
  Administered 2017-06-08: 250 mg via INTRAMUSCULAR

## 2017-06-08 NOTE — Progress Notes (Signed)
Routine Prenatal Care Visit  Subjective  AMORY ZBIKOWSKI is a 32 y.o. [id number] at [redacted]w[redacted]d being seen today for ongoing prenatal care.  She is currently monitored for the following issues for this high-risk pregnancy and has Hormonal contraceptive; Allergy; History of migraine headaches; Pap smear for cervical cancer screening; Sciatica of left side; Generalized anxiety disorder; Encounter for preventive health examination; Supervision of high risk pregnancy, antepartum; History of cesarean delivery; and History of preterm delivery, currently pregnant on their problem list.  ----------------------------------------------------------------------------------- Patient reports no complaints.  Patient still has symptoms of dizziness. Some improvement since she stopped working.  Contractions: Not present. Vag. Bleeding: None.  Movement: Present. Denies leaking of fluid.  ----------------------------------------------------------------------------------- The following portions of the patient's history were reviewed and updated as appropriate: allergies, current medications, past family history, past medical history, past social history, past surgical history and problem list. Problem list updated.   Objective  Blood pressure 128/60, weight 226 lb (102.5 kg), last menstrual period 01/18/2017. Pregravid weight 255 lb (115.7 kg) Total Weight Gain  (-13.154 kg) Urinalysis:      Fetal Status: Fetal Heart Rate (bpm): 140   Movement: Present     General:  Alert, oriented and cooperative. Patient is in no acute distress.  Skin: Skin is warm and dry. No rash noted.   Cardiovascular: Normal heart rate noted  Respiratory: Normal respiratory effort, no problems with respiration noted  Abdomen: Soft, gravid, appropriate for gestational age. Pain/Pressure: Absent     Pelvic:  Cervical exam deferred        Extremities: Normal range of motion.  Edema: None  ental Status: Normal mood and affect. Normal behavior.  Normal judgment and thought content.     Assessment   32 y.o. [id number] at [redacted]w[redacted]d by  10/25/2017, by Last Menstrual Period presenting for routine prenatal visit  Plan   Pregnancy # 4 Problems (from 03/02/17 to present)    Problem Noted Resolved   History of preterm delivery, currently pregnant 04/04/2017 by Farrel Conners, CNM No   Overview Signed 05/08/2017  9:13 AM by Farrel Conners, CNM    Preterm delivery at 33 weeks with G2      Supervision of high risk pregnancy, antepartum 03/02/2017 by Oswaldo Conroy, CNM No   Overview Addendum 06/08/2017  5:04 PM by Natale Milch, MD    [facility]inic [facility] Prenatal Labs  Dating LMP = 7wk [place] Blood type: --/--/A NEG  (01/03 0457)   Genetic Screen 1 Screen:    AFP:     Quad:     NIPS: Antibody:NEG (01/03 0457)  Anatomic [place]  Normal XX Rubella: 1.40 (12/05 1126)  Varicella:  Immune  GTT Early:  110              Third trimester:  RPR: Non Reactive (12/05 1126)   Rhogam  HBsAg: Negative (12/05 1126)   TDaP vaccine                        Flu Shot: 04/11/17 HIV: Non Reactive (12/05 1126)   Baby Food     Bottle                           GBS:   Contraception  Tubal [ ]  sign papers Pap: 2016 NIL, will need postpartum  CBB   Given information   CS/VBAC  undecided leaning towards c/s with tubal    Support Person  History of cesarean delivery 03/02/2017 by Oswaldo ConroySchmid, Jacelyn Y, CNM No   Overview Signed 05/08/2017  9:13 AM by Farrel ConnersGutierrez, Colleen, CNM    For failure to descend           Gestational age appropriate obstetric precautions including but not limited to vaginal bleeding, contractions, leaking of fluid and fetal movement were reviewed in detail with the patient.    Discussed contraception postpartum. Will need to sign papers 90-30 days out from tubal ligation Given information on prenatal classes. Given information on cord blood banking. Neurology and opthalmology referrals pending.  Plan to repeat MRI postpartum with  and without contrast for microadenoma evaluation if prolactin is still elevated.  Continue weekly 17-P injections for history of preterm birth.  Return in about 4 weeks (around 07/06/2017) for ROB.  Adelene Idlerhristanna Bethanie Bloxom MD [facility], [facility] 06/08/2017, 5:50 PM

## 2017-06-15 ENCOUNTER — Ambulatory Visit (INDEPENDENT_AMBULATORY_CARE_PROVIDER_SITE_OTHER): Payer: Medicaid Other

## 2017-06-15 DIAGNOSIS — O09219 Supervision of pregnancy with history of pre-term labor, unspecified trimester: Secondary | ICD-10-CM

## 2017-06-15 DIAGNOSIS — Z8751 Personal history of pre-term labor: Secondary | ICD-10-CM

## 2017-06-15 MED ORDER — HYDROXYPROGESTERONE CAPROATE 275 MG/1.1ML ~~LOC~~ SOAJ
275.0000 mg | Freq: Once | SUBCUTANEOUS | Status: AC
  Administered 2017-06-15: 275 mg via SUBCUTANEOUS

## 2017-06-22 ENCOUNTER — Ambulatory Visit (INDEPENDENT_AMBULATORY_CARE_PROVIDER_SITE_OTHER): Payer: Medicaid Other

## 2017-06-22 DIAGNOSIS — Z8751 Personal history of pre-term labor: Secondary | ICD-10-CM | POA: Diagnosis not present

## 2017-06-22 MED ORDER — HYDROXYPROGESTERONE CAPROATE 250 MG/ML IM OIL
250.0000 mg | TOPICAL_OIL | Freq: Once | INTRAMUSCULAR | Status: AC
  Administered 2017-06-22: 250 mg via INTRAMUSCULAR

## 2017-06-25 MED ORDER — HYDROXYPROGESTERONE CAPROATE 250 MG/ML IM OIL
250.0000 mg | TOPICAL_OIL | Freq: Once | INTRAMUSCULAR | Status: AC
  Administered 2017-06-15: 250 mg via INTRAMUSCULAR

## 2017-06-25 NOTE — Addendum Note (Signed)
Addended by: Liliane ShiSHAW, BEVERLY M on: 06/25/2017 11:49 AM   Modules accepted: Orders

## 2017-06-29 ENCOUNTER — Ambulatory Visit (INDEPENDENT_AMBULATORY_CARE_PROVIDER_SITE_OTHER): Payer: Medicaid Other

## 2017-06-29 DIAGNOSIS — O09219 Supervision of pregnancy with history of pre-term labor, unspecified trimester: Secondary | ICD-10-CM | POA: Diagnosis not present

## 2017-06-29 DIAGNOSIS — O09899 Supervision of other high risk pregnancies, unspecified trimester: Secondary | ICD-10-CM

## 2017-06-29 MED ORDER — HYDROXYPROGESTERONE CAPROATE 250 MG/ML IM OIL
250.0000 mg | TOPICAL_OIL | Freq: Once | INTRAMUSCULAR | Status: AC
  Administered 2017-06-29: 250 mg via INTRAMUSCULAR

## 2017-07-05 ENCOUNTER — Encounter: Payer: Self-pay | Admitting: Obstetrics and Gynecology

## 2017-07-05 ENCOUNTER — Ambulatory Visit (INDEPENDENT_AMBULATORY_CARE_PROVIDER_SITE_OTHER): Payer: Medicaid Other | Admitting: Obstetrics and Gynecology

## 2017-07-05 VITALS — BP 130/84 | Wt 226.0 lb

## 2017-07-05 DIAGNOSIS — Z113 Encounter for screening for infections with a predominantly sexual mode of transmission: Secondary | ICD-10-CM

## 2017-07-05 DIAGNOSIS — Z131 Encounter for screening for diabetes mellitus: Secondary | ICD-10-CM

## 2017-07-05 DIAGNOSIS — O0990 Supervision of high risk pregnancy, unspecified, unspecified trimester: Secondary | ICD-10-CM

## 2017-07-05 DIAGNOSIS — Z98891 History of uterine scar from previous surgery: Secondary | ICD-10-CM

## 2017-07-05 DIAGNOSIS — O09899 Supervision of other high risk pregnancies, unspecified trimester: Secondary | ICD-10-CM

## 2017-07-05 DIAGNOSIS — O09219 Supervision of pregnancy with history of pre-term labor, unspecified trimester: Secondary | ICD-10-CM

## 2017-07-05 DIAGNOSIS — Z3A24 24 weeks gestation of pregnancy: Secondary | ICD-10-CM

## 2017-07-05 MED ORDER — HYDROXYPROGESTERONE CAPROATE 250 MG/ML IM OIL
250.0000 mg | TOPICAL_OIL | Freq: Once | INTRAMUSCULAR | Status: AC
  Administered 2017-07-05: 250 mg via INTRAMUSCULAR

## 2017-07-05 NOTE — Progress Notes (Incomplete)
Routine Prenatal Care Visit  Subjective  Jamie Weber is a 32 y.o. [id number] at [redacted]w[redacted]d being seen today for ongoing prenatal care.  She is currently monitored for the following issues for this {Blank single:19197::"high-risk","low-risk"} pregnancy and has Hormonal contraceptive; Allergy; History of migraine headaches; Pap smear for cervical cancer screening; Sciatica of left side; Generalized anxiety disorder; Encounter for preventive health examination; Supervision of high risk pregnancy, antepartum; History of cesarean delivery; and History of preterm delivery, currently pregnant on their problem list.  ----------------------------------------------------------------------------------- Patient reports {sx:14538}.   Contractions: Not present. Vag. Bleeding: None.  Movement: Present. Denies leaking of fluid.  ----------------------------------------------------------------------------------- The following portions of the patient's history were reviewed and updated as appropriate: allergies, current medications, past family history, past medical history, past social history, past surgical history and problem list. Problem list updated.   Objective  Blood pressure 130/84, weight 226 lb (102.5 kg), last menstrual period 01/18/2017. Pregravid weight 255 lb (115.7 kg) Total Weight Gain -29 lb (-13.2 kg) Urinalysis:      Fetal Status: Fetal Heart Rate (bpm): 145 Fundal Height: 26 cm Movement: Present     General:  Alert, oriented and cooperative. Patient is in no acute distress.  Skin: Skin is warm and dry. No rash noted.   Cardiovascular: Normal heart rate noted  Respiratory: Normal respiratory effort, no problems with respiration noted  Abdomen: Soft, gravid, appropriate for gestational age. Pain/Pressure: Absent     Pelvic:  {Blank single:19197::"Cervical exam performed","Cervical exam deferred"}        Extremities: Normal range of motion.     Mental Status: Normal mood and affect. Normal  behavior. Normal judgment and thought content.   Assessment   32 y.o. [id number] at [redacted]w[redacted]d by  10/25/2017, by Last Menstrual Period presenting for {Blank single:19197::"routine","work-in"} prenatal visit  Plan   Pregnancy # 4 Problems (from 03/02/17 to present)    Problem Noted Resolved   History of preterm delivery, currently pregnant 04/04/2017 by Farrel Conners, CNM No   Overview Signed 05/08/2017  9:13 AM by Farrel Conners, CNM    Preterm delivery at 33 weeks with G2      Supervision of high risk pregnancy, antepartum 03/02/2017 by Oswaldo Conroy, CNM No   Overview Addendum 06/08/2017  5:04 PM by Natale Milch, MD    [facility]inic [facility] Prenatal Labs  Dating LMP = 7wk [place] Blood type: --/--/A NEG  (01/03 0457)   Genetic Screen 1 Screen:    AFP:     Quad:     NIPS: Antibody:NEG (01/03 0457)  Anatomic [place]  Normal XX Rubella: 1.40 (12/05 1126)  Varicella:  Immune  GTT Early:  110              Third trimester:  RPR: Non Reactive (12/05 1126)   Rhogam  HBsAg: Negative (12/05 1126)   TDaP vaccine                        Flu Shot: 04/11/17 HIV: Non Reactive (12/05 1126)   Baby Food     Bottle                           GBS:   Contraception  Tubal [ ]  sign papers Pap: 2016 NIL, will need postpartum  CBB   Given information   CS/VBAC  undecided leaning towards c/s with tubal    Support Person  History of cesarean delivery 03/02/2017 by Oswaldo ConroySchmid, Jacelyn Y, CNM No   Overview Signed 05/08/2017  9:13 AM by Farrel ConnersGutierrez, Colleen, CNM    For failure to descend           {Blank single:19197::"Term","Preterm"} labor symptoms and general obstetric precautions including but not limited to vaginal bleeding, contractions, leaking of fluid and fetal movement were reviewed in detail with the patient. Please refer to After Visit Summary for other counseling recommendations.   Return in about 1 month (around 08/02/2017) for schedule growth u/s, 28 week labs, routine prenatal.  Thomasene MohairStephen  Jonathin Heinicke, MD, Merlinda FrederickFACOG [facility], [facility] 07/05/2017 2:44 PM

## 2017-07-05 NOTE — Progress Notes (Signed)
Routine Prenatal Care Visit  Subjective  Jamie Weber is a 32 y.o. [id number] at 383w0d being seen today for ongoing prenatal care.  She is currently monitored for the following issues for this high-risk pregnancy and has Hormonal contraceptive; Allergy; History of migraine headaches; Pap smear for cervical cancer screening; Sciatica of left side; Generalized anxiety disorder; Encounter for preventive health examination; Supervision of high risk pregnancy, antepartum; History of cesarean delivery; and History of preterm delivery, currently pregnant on their problem list.  ----------------------------------------------------------------------------------- Patient reports no complaints.   Contractions: Not present. Vag. Bleeding: None.  Movement: Present. Denies leaking of fluid.  ----------------------------------------------------------------------------------- The following portions of the patient's history were reviewed and updated as appropriate: allergies, current medications, past family history, past medical history, past social history, past surgical history and problem list. Problem list updated.   Objective  Blood pressure 130/84, weight 226 lb (102.5 kg), last menstrual period 01/18/2017. Pregravid weight 255 lb (115.7 kg) Total Weight Gain -29 lb (-13.2 kg) Urinalysis:      Fetal Status: Fetal Heart Rate (bpm): 145 Fundal Height: 26 cm Movement: Present     General:  Alert, oriented and cooperative. Patient is in no acute distress.  Skin: Skin is warm and dry. No rash noted.   Cardiovascular: Normal heart rate noted  Respiratory: Normal respiratory effort, no problems with respiration noted  Abdomen: Soft, gravid, appropriate for gestational age. Pain/Pressure: Absent     Pelvic:  Cervical exam deferred        Extremities: Normal range of motion.     Mental Status: Normal mood and affect. Normal behavior. Normal judgment and thought content.   Assessment   32 y.o. [id number] at  793w0d by  10/25/2017, by Last Menstrual Period presenting for routine prenatal visit  Plan   Pregnancy # 4 Problems (from 03/02/17 to present)    Problem Noted Resolved   History of preterm delivery, currently pregnant 04/04/2017 by Farrel ConnersGutierrez, Colleen, CNM No   Overview Signed 05/08/2017  9:13 AM by Farrel ConnersGutierrez, Colleen, CNM    Preterm delivery at 33 weeks with G2      Supervision of high risk pregnancy, antepartum 03/02/2017 by Oswaldo ConroySchmid, Jacelyn Y, CNM No   Overview Addendum 06/08/2017  5:04 PM by Natale MilchSchuman, Christanna R, MD    [facility]inic [facility] Prenatal Labs  Dating LMP = 7wk US Blood type: --/--/A NEG  (01/03 0457)   Genetic Screen 1 Screen:    AFP:     Quad:     NIPS: Antibody:NEG (01/03 0457)  Anatomic US  Normal XX Rubella: 1.40 (12/05 1126)  Varicella:  Immune  GTT Early:  110              Third trimester:  RPR: Non Reactive (12/05 1126)   Rhogam  HBsAg: Negative (12/05 1126)   TDaP vaccine                        Flu Shot: 04/11/17 HIV: Non Reactive (12/05 1126)   Baby Food     Bottle                           GBS:   Contraception  Tubal [ ]  sign papers Pap: 2016 NIL, will need postpartum  CBB   Given information   CS/VBAC  undecided leaning towards c/s with tubal    Support Person  History of cesarean delivery 03/02/2017 by Oswaldo Conroy, CNM No   Overview Signed 05/08/2017  9:13 AM by Farrel Conners, CNM    For failure to descend           Preterm labor symptoms and general obstetric precautions including but not limited to vaginal bleeding, contractions, leaking of fluid and fetal movement were reviewed in detail with the patient. Please refer to After Visit Summary for other counseling recommendations.   - 17OHP today, continue weekly  Return in about 1 month (around 08/02/2017) for schedule growth u/s, 28 week labs, routine prenatal.  Thomasene Mohair, MD, [facility], [facility] 07/05/2017 2:43 PM

## 2017-07-05 NOTE — Progress Notes (Incomplete)
Routine Prenatal Care Visit  Subjective  Jamie Weber is a 32 y.o. [id number] at [redacted]w[redacted]d being seen today for ongoing prenatal care.  She is currently monitored for the following issues for this {Blank single:19197::"high-risk","low-risk"} pregnancy and has Hormonal contraceptive; Allergy; History of migraine headaches; Pap smear for cervical cancer screening; Sciatica of left side; Generalized anxiety disorder; Encounter for preventive health examination; Supervision of high risk pregnancy, antepartum; History of cesarean delivery; and History of preterm delivery, currently pregnant on their problem list.  ----------------------------------------------------------------------------------- Patient reports {sx:14538}.   Contractions: Not present. Vag. Bleeding: None.  Movement: Present. Denies leaking of fluid.  ----------------------------------------------------------------------------------- The following portions of the patient's history were reviewed and updated as appropriate: allergies, current medications, past family history, past medical history, past social history, past surgical history and problem list. Problem list updated.   Objective  Blood pressure 130/84, weight 226 lb (102.5 kg), last menstrual period 01/18/2017. Pregravid weight 255 lb (115.7 kg) Total Weight Gain -29 lb (-13.2 kg) Urinalysis:      Fetal Status: Fetal Heart Rate (bpm): 145 Fundal Height: 26 cm Movement: Present     General:  Alert, oriented and cooperative. Patient is in no acute distress.  Skin: Skin is warm and dry. No rash noted.   Cardiovascular: Normal heart rate noted  Respiratory: Normal respiratory effort, no problems with respiration noted  Abdomen: Soft, gravid, appropriate for gestational age. Pain/Pressure: Absent     Pelvic:  {Blank single:19197::"Cervical exam performed","Cervical exam deferred"}        Extremities: Normal range of motion.     Mental Status: Normal mood and affect. Normal  behavior. Normal judgment and thought content.   Assessment   32 y.o. [id number] at [redacted]w[redacted]d by  10/25/2017, by Last Menstrual Period presenting for {Blank single:19197::"routine","work-in"} prenatal visit  Plan   Pregnancy # 4 Problems (from 03/02/17 to present)    Problem Noted Resolved   History of preterm delivery, currently pregnant 04/04/2017 by Farrel Conners, CNM No   Overview Signed 05/08/2017  9:13 AM by Farrel Conners, CNM    Preterm delivery at 33 weeks with G2      Supervision of high risk pregnancy, antepartum 03/02/2017 by Oswaldo Conroy, CNM No   Overview Addendum 06/08/2017  5:04 PM by Natale Milch, MD    [facility]inic [facility] Prenatal Labs  Dating LMP = 7wk [place] Blood type: --/--/A NEG  (01/03 0457)   Genetic Screen 1 Screen:    AFP:     Quad:     NIPS: Antibody:NEG (01/03 0457)  Anatomic [place]  Normal XX Rubella: 1.40 (12/05 1126)  Varicella:  Immune  GTT Early:  110              Third trimester:  RPR: Non Reactive (12/05 1126)   Rhogam  HBsAg: Negative (12/05 1126)   TDaP vaccine                        Flu Shot: 04/11/17 HIV: Non Reactive (12/05 1126)   Baby Food     Bottle                           GBS:   Contraception  Tubal [ ]  sign papers Pap: 2016 NIL, will need postpartum  CBB   Given information   CS/VBAC  undecided leaning towards c/s with tubal    Support Person  History of cesarean delivery 03/02/2017 by Oswaldo ConroySchmid, Jacelyn Y, CNM No   Overview Signed 05/08/2017  9:13 AM by Farrel ConnersGutierrez, Colleen, CNM    For failure to descend           {Blank single:19197::"Term","Preterm"} labor symptoms and general obstetric precautions including but not limited to vaginal bleeding, contractions, leaking of fluid and fetal movement were reviewed in detail with the patient. Please refer to After Visit Summary for other counseling recommendations.   Return in about 1 month (around 08/02/2017) for schedule growth u/s, 28 week labs, routine prenatal.  Thomasene MohairStephen  Tricia Pledger, MD, Merlinda FrederickFACOG [facility], [facility] 07/05/2017 2:43 PM

## 2017-07-13 ENCOUNTER — Ambulatory Visit (INDEPENDENT_AMBULATORY_CARE_PROVIDER_SITE_OTHER): Payer: Medicaid Other

## 2017-07-13 DIAGNOSIS — O09219 Supervision of pregnancy with history of pre-term labor, unspecified trimester: Secondary | ICD-10-CM | POA: Diagnosis not present

## 2017-07-13 DIAGNOSIS — O09899 Supervision of other high risk pregnancies, unspecified trimester: Secondary | ICD-10-CM

## 2017-07-13 DIAGNOSIS — O0990 Supervision of high risk pregnancy, unspecified, unspecified trimester: Secondary | ICD-10-CM

## 2017-07-13 MED ORDER — HYDROXYPROGESTERONE CAPROATE 250 MG/ML IM OIL
250.0000 mg | TOPICAL_OIL | Freq: Once | INTRAMUSCULAR | Status: AC
  Administered 2017-07-13: 250 mg via INTRAMUSCULAR

## 2017-07-19 ENCOUNTER — Ambulatory Visit: Payer: Medicaid Other

## 2017-07-19 ENCOUNTER — Ambulatory Visit (INDEPENDENT_AMBULATORY_CARE_PROVIDER_SITE_OTHER): Payer: Medicaid Other

## 2017-07-19 DIAGNOSIS — Z3A26 26 weeks gestation of pregnancy: Secondary | ICD-10-CM | POA: Diagnosis not present

## 2017-07-19 DIAGNOSIS — O09219 Supervision of pregnancy with history of pre-term labor, unspecified trimester: Secondary | ICD-10-CM

## 2017-07-19 DIAGNOSIS — O09899 Supervision of other high risk pregnancies, unspecified trimester: Secondary | ICD-10-CM

## 2017-07-19 MED ORDER — HYDROXYPROGESTERONE CAPROATE 250 MG/ML IM OIL
250.0000 mg | TOPICAL_OIL | Freq: Once | INTRAMUSCULAR | Status: AC
  Administered 2017-07-19: 250 mg via INTRAMUSCULAR

## 2017-07-27 ENCOUNTER — Ambulatory Visit (INDEPENDENT_AMBULATORY_CARE_PROVIDER_SITE_OTHER): Payer: Medicaid Other

## 2017-07-27 DIAGNOSIS — O09219 Supervision of pregnancy with history of pre-term labor, unspecified trimester: Secondary | ICD-10-CM | POA: Diagnosis not present

## 2017-07-27 DIAGNOSIS — O09899 Supervision of other high risk pregnancies, unspecified trimester: Secondary | ICD-10-CM

## 2017-07-27 DIAGNOSIS — Z3A27 27 weeks gestation of pregnancy: Secondary | ICD-10-CM

## 2017-07-27 MED ORDER — HYDROXYPROGESTERONE CAPROATE 250 MG/ML IM OIL
250.0000 mg | TOPICAL_OIL | Freq: Once | INTRAMUSCULAR | Status: AC
  Administered 2017-07-27: 250 mg via INTRAMUSCULAR

## 2017-08-01 ENCOUNTER — Other Ambulatory Visit: Payer: Medicaid Other

## 2017-08-01 ENCOUNTER — Ambulatory Visit (INDEPENDENT_AMBULATORY_CARE_PROVIDER_SITE_OTHER): Payer: Medicaid Other

## 2017-08-01 ENCOUNTER — Ambulatory Visit (INDEPENDENT_AMBULATORY_CARE_PROVIDER_SITE_OTHER): Payer: Medicaid Other | Admitting: Obstetrics and Gynecology

## 2017-08-01 VITALS — BP 126/84 | Wt 226.0 lb

## 2017-08-01 DIAGNOSIS — O09899 Supervision of other high risk pregnancies, unspecified trimester: Secondary | ICD-10-CM

## 2017-08-01 DIAGNOSIS — Z131 Encounter for screening for diabetes mellitus: Secondary | ICD-10-CM

## 2017-08-01 DIAGNOSIS — Z113 Encounter for screening for infections with a predominantly sexual mode of transmission: Secondary | ICD-10-CM

## 2017-08-01 DIAGNOSIS — O0990 Supervision of high risk pregnancy, unspecified, unspecified trimester: Secondary | ICD-10-CM

## 2017-08-01 DIAGNOSIS — Z98891 History of uterine scar from previous surgery: Secondary | ICD-10-CM

## 2017-08-01 DIAGNOSIS — Z3A27 27 weeks gestation of pregnancy: Secondary | ICD-10-CM

## 2017-08-01 DIAGNOSIS — O09219 Supervision of pregnancy with history of pre-term labor, unspecified trimester: Secondary | ICD-10-CM | POA: Diagnosis not present

## 2017-08-01 DIAGNOSIS — F411 Generalized anxiety disorder: Secondary | ICD-10-CM

## 2017-08-01 MED ORDER — RHO D IMMUNE GLOBULIN 1500 UNIT/2ML IJ SOSY
300.0000 ug | PREFILLED_SYRINGE | Freq: Once | INTRAMUSCULAR | Status: AC
  Administered 2017-08-01: 300 ug via INTRAMUSCULAR

## 2017-08-01 NOTE — Progress Notes (Signed)
Routine Prenatal Care Visit  Subjective  Jamie Weber is a 32 y.o. [id number] at [redacted]w[redacted]d being seen today for ongoing prenatal care.  She is currently monitored for the following issues for this high-risk pregnancy and has Hormonal contraceptive; Allergy; History of migraine headaches; Pap smear for cervical cancer screening; Sciatica of left side; Generalized anxiety disorder; Encounter for preventive health examination; Supervision of high risk pregnancy, antepartum; History of cesarean delivery; and History of preterm delivery, currently pregnant on their problem list.  ----------------------------------------------------------------------------------- Patient reports no complaints.   Contractions: Not present. Vag. Bleeding: None.  Movement: Present. Denies leaking of fluid.  U/S today shows growth at 67th%ile, AFI 22 cm.  See report below.  ----------------------------------------------------------------------------------- The following portions of the patient's history were reviewed and updated as appropriate: allergies, current medications, past family history, past medical history, past social history, past surgical history and problem list. Problem list updated.   Objective  Blood pressure 126/84, weight 226 lb (102.5 kg), last menstrual period 01/18/2017. Pregravid weight 255 lb (115.7 kg) Total Weight Gain -29 lb (-13.2 kg) Urinalysis:      Fetal Status: Fetal Heart Rate (bpm): 140   Movement: Present     General:  Alert, oriented and cooperative. Patient is in no acute distress.  Skin: Skin is warm and dry. No rash noted.   Cardiovascular: Normal heart rate noted  Respiratory: Normal respiratory effort, no problems with respiration noted  Abdomen: Soft, gravid, appropriate for gestational age. Pain/Pressure: Absent     Pelvic:  Cervical exam deferred        Extremities: Normal range of motion.  Edema: None  Mental Status: Normal mood and affect. Normal behavior. Normal judgment  and thought content.   US Ob Follow Up  Result Date: 08/01/2017 Patient Name: Jamie Weber DOB: 1985/07/27 MRN: [id number] ULTRASOUND REPORT Location: [facility] Date of Service: 08/01/2017 Indications:growth/afi Findings: Mason Jim intrauterine pregnancy is visualized with FHR at 137 BPM. Biometrics give an (U/S) Gestational age of [redacted]w[redacted]d and an (U/S) EDD of 10/18/2017; this correlates with the clinically established Estimated Date of Delivery: 10/25/17. Fetal presentation is Cephalic. Placenta: Anterior, Grade1. AFI: 22.28 cm Growth percentile is 67.6% . EFW: 1302 grams (2lb 14oz). Impression: 1. [redacted]w[redacted]d Viable Singleton Intrauterine pregnancy previously established criteria. 2. Growth is 67.6 %ile.  AFI is 22.28 cm. Recommendations: 1.Clinical correlation with the patient's History and Physical Exam. Mital bahen Leodis Binet, RDMS The ultrasound images and findings were reviewed by me and I agree with the above report. Thomasene Mohair, MD, [facility], [facility] 08/01/2017 10:06 AM    Assessment   31 y.o. [id number] at [redacted]w[redacted]d by  10/25/2017, by Last Menstrual Period presenting for routine prenatal visit  Plan   Pregnancy # 4 Problems (from 03/02/17 to present)    Problem Noted Resolved   History of preterm delivery, currently pregnant 04/04/2017 by Farrel Conners, CNM No   Overview Signed 05/08/2017  9:13 AM by Farrel Conners, CNM    Preterm delivery at 33 weeks with G2      Supervision of high risk pregnancy, antepartum 03/02/2017 by Oswaldo Conroy, CNM No   Overview Addendum 06/08/2017  5:04 PM by Natale Milch, MD    Clinic [facility] Prenatal Labs  Dating LMP = 7wk [place] Blood type: --/--/A NEG  (01/03 0457)   Genetic Screen 1 Screen:    AFP:     Quad:     NIPS: Antibody:NEG (01/03 0457)  Anatomic [place]  Normal XX  Rubella: 1.40 (12/05 1126)  Varicella:  Immune  GTT Early:  110              Third trimester:  RPR: Non Reactive (12/05 1126)   Rhogam  HBsAg:  Negative (12/05 1126)   TDaP vaccine                        Flu Shot: 04/11/17 HIV: Non Reactive (12/05 1126)   Baby Food     Bottle                           GBS:   Contraception  Tubal  sign papers Pap: 2016 NIL, will need postpartum  CBB   Given information   CS/VBAC  undecided leaning towards c/s with tubal    Support Person              History of cesarean delivery 03/02/2017 by Oswaldo Conroy, CNM No   Overview Signed 05/08/2017  9:13 AM by Farrel Conners, CNM    For failure to descend           Preterm labor symptoms and general obstetric precautions including but not limited to vaginal bleeding, contractions, leaking of fluid and fetal movement were reviewed in detail with the patient. Please refer to After Visit Summary for other counseling recommendations.   - 28 week labs today - Rhogam today - Return to clinic Friday for 17 OHP shot and weekly thereafter. Patient wants to discontinue shots at 34 weeks. Discussed recommendations from studies showing benefit, if continue shots up to 36 weeks.   Return in about 2 weeks (around 08/15/2017) for Routine Prenatal Appointment (keep weekly 17OHP injections).  Thomasene Mohair, MD, [facility], [facility] 08/02/2017 10:55 AM

## 2017-08-02 ENCOUNTER — Encounter: Payer: Self-pay | Admitting: Obstetrics and Gynecology

## 2017-08-02 LAB — 28 WEEKS RH-PANEL
ANTIBODY SCREEN: NEGATIVE
Basophils Absolute: 0 10*3/uL (ref 0.0–0.2)
Basos: 0 %
EOS (ABSOLUTE): 0.1 10*3/uL (ref 0.0–0.4)
EOS: 1 %
Gestational Diabetes Screen: 118 mg/dL (ref 65–139)
HIV SCREEN 4TH GENERATION: NONREACTIVE
Hematocrit: 36.7 % (ref 34.0–46.6)
Hemoglobin: 11.6 g/dL (ref 11.1–15.9)
IMMATURE GRANS (ABS): 0 10*3/uL (ref 0.0–0.1)
IMMATURE GRANULOCYTES: 0 %
LYMPHS: 15 %
Lymphocytes Absolute: 1.4 10*3/uL (ref 0.7–3.1)
MCH: 29.4 pg (ref 26.6–33.0)
MCHC: 31.6 g/dL (ref 31.5–35.7)
MCV: 93 fL (ref 79–97)
MONOCYTES: 6 %
Monocytes Absolute: 0.5 10*3/uL (ref 0.1–0.9)
NEUTROS ABS: 7 10*3/uL (ref 1.4–7.0)
NEUTROS PCT: 78 %
Platelets: 215 10*3/uL (ref 150–379)
RBC: 3.95 x10E6/uL (ref 3.77–5.28)
RDW: 14.4 % (ref 12.3–15.4)
RPR Ser Ql: NONREACTIVE
WBC: 9 10*3/uL (ref 3.4–10.8)

## 2017-08-02 NOTE — Progress Notes (Incomplete)
Routine Prenatal Care Visit  Subjective  Jamie Weber is a 32 y.o. [id number] at [redacted]w[redacted]d being seen today for ongoing prenatal care.  She is currently monitored for the following issues for this {Blank single:19197::"high-risk","low-risk"} pregnancy and has Hormonal contraceptive; Allergy; History of migraine headaches; Pap smear for cervical cancer screening; Sciatica of left side; Generalized anxiety disorder; Encounter for preventive health examination; Supervision of high risk pregnancy, antepartum; History of cesarean delivery; and History of preterm delivery, currently pregnant on their problem list.  ----------------------------------------------------------------------------------- Patient reports {sx:14538}.   Contractions: Not present. Vag. Bleeding: None.  Movement: Present. Denies leaking of fluid.  ----------------------------------------------------------------------------------- The following portions of the patient's history were reviewed and updated as appropriate: allergies, current medications, past family history, past medical history, past social history, past surgical history and problem list. Problem list updated.   Objective  Blood pressure 126/84, weight 226 lb (102.5 kg), last menstrual period 01/18/2017. Pregravid weight 255 lb (115.7 kg) Total Weight Gain -29 lb (-13.2 kg) Urinalysis:      Fetal Status: Fetal Heart Rate (bpm): 140   Movement: Present     General:  Alert, oriented and cooperative. Patient is in no acute distress.  Skin: Skin is warm and dry. No rash noted.   Cardiovascular: Normal heart rate noted  Respiratory: Normal respiratory effort, no problems with respiration noted  Abdomen: Soft, gravid, appropriate for gestational age. Pain/Pressure: Absent     Pelvic:  {Blank single:19197::"Cervical exam performed","Cervical exam deferred"}        Extremities: Normal range of motion.  Edema: None  Mental Status: Normal mood and affect. Normal behavior.  Normal judgment and thought content.   Assessment   32 y.o. [id number] at [redacted]w[redacted]d by  10/25/2017, by Last Menstrual Period presenting for {Blank single:19197::"routine","work-in"} prenatal visit  Plan   Pregnancy # 4 Problems (from 03/02/17 to present)    Problem Noted Resolved   History of preterm delivery, currently pregnant 04/04/2017 by Farrel Conners, CNM No   Overview Signed 05/08/2017  9:13 AM by Farrel Conners, CNM    Preterm delivery at 33 weeks with G2      Supervision of high risk pregnancy, antepartum 03/02/2017 by Oswaldo Conroy, CNM No   Overview Addendum 06/08/2017  5:04 PM by Natale Milch, MD    Clinic [facility] Prenatal Labs  Dating LMP = 7wk [place] Blood type: --/--/A NEG  (01/03 0457)   Genetic Screen 1 Screen:    AFP:     Quad:     NIPS: Antibody:NEG (01/03 0457)  Anatomic [place]  Normal XX Rubella: 1.40 (12/05 1126)  Varicella:  Immune  GTT Early:  110              Third trimester:  RPR: Non Reactive (12/05 1126)   Rhogam  HBsAg: Negative (12/05 1126)   TDaP vaccine                        Flu Shot: 04/11/17 HIV: Non Reactive (12/05 1126)   Baby Food     Bottle                           GBS:   Contraception  Tubal  sign papers Pap: 2016 NIL, will need postpartum  CBB   Given information   CS/VBAC  undecided leaning towards c/s with tubal    Support Person  History of cesarean delivery 03/02/2017 by Oswaldo Conroy, CNM No   Overview Signed 05/08/2017  9:13 AM by Farrel Conners, CNM    For failure to descend           {Blank single:19197::"Term","Preterm"} labor symptoms and general obstetric precautions including but not limited to vaginal bleeding, contractions, leaking of fluid and fetal movement were reviewed in detail with the patient. Please refer to After Visit Summary for other counseling recommendations.   Return in about 2 weeks (around 08/15/2017) for Routine Prenatal Appointment (keep weekly 17OHP injections).  Thomasene Mohair, MD, [facility], [facility] 08/02/2017 10:55 AM

## 2017-08-03 ENCOUNTER — Ambulatory Visit: Payer: Medicaid Other

## 2017-08-03 ENCOUNTER — Ambulatory Visit (INDEPENDENT_AMBULATORY_CARE_PROVIDER_SITE_OTHER): Payer: Medicaid Other

## 2017-08-03 DIAGNOSIS — O09219 Supervision of pregnancy with history of pre-term labor, unspecified trimester: Secondary | ICD-10-CM | POA: Diagnosis not present

## 2017-08-03 DIAGNOSIS — O09899 Supervision of other high risk pregnancies, unspecified trimester: Secondary | ICD-10-CM

## 2017-08-03 MED ORDER — HYDROXYPROGESTERONE CAPROATE 250 MG/ML IM OIL
250.0000 mg | TOPICAL_OIL | Freq: Once | INTRAMUSCULAR | Status: AC
  Administered 2017-08-03: 250 mg via INTRAMUSCULAR

## 2017-08-03 NOTE — Progress Notes (Signed)
Given again in LUOQ per pt request as she had a Rhophlac injection on Wednesday in her RUOQ.

## 2017-08-07 ENCOUNTER — Ambulatory Visit: Payer: Medicaid Other | Admitting: Neurology

## 2017-08-10 ENCOUNTER — Ambulatory Visit: Payer: Medicaid Other

## 2017-08-10 ENCOUNTER — Ambulatory Visit (INDEPENDENT_AMBULATORY_CARE_PROVIDER_SITE_OTHER): Payer: Medicaid Other

## 2017-08-10 DIAGNOSIS — Z8751 Personal history of pre-term labor: Secondary | ICD-10-CM

## 2017-08-10 MED ORDER — HYDROXYPROGESTERONE CAPROATE 250 MG/ML IM OIL
250.0000 mg | TOPICAL_OIL | Freq: Once | INTRAMUSCULAR | Status: AC
  Administered 2017-08-10: 250 mg via INTRAMUSCULAR

## 2017-08-16 ENCOUNTER — Ambulatory Visit (INDEPENDENT_AMBULATORY_CARE_PROVIDER_SITE_OTHER): Payer: Medicaid Other | Admitting: Obstetrics and Gynecology

## 2017-08-16 ENCOUNTER — Encounter: Payer: Self-pay | Admitting: Obstetrics and Gynecology

## 2017-08-16 VITALS — BP 112/66 | Wt 225.0 lb

## 2017-08-16 DIAGNOSIS — Z3A30 30 weeks gestation of pregnancy: Secondary | ICD-10-CM

## 2017-08-16 DIAGNOSIS — Z23 Encounter for immunization: Secondary | ICD-10-CM | POA: Diagnosis not present

## 2017-08-16 DIAGNOSIS — F411 Generalized anxiety disorder: Secondary | ICD-10-CM

## 2017-08-16 DIAGNOSIS — Z8751 Personal history of pre-term labor: Secondary | ICD-10-CM | POA: Diagnosis not present

## 2017-08-16 DIAGNOSIS — O09219 Supervision of pregnancy with history of pre-term labor, unspecified trimester: Secondary | ICD-10-CM

## 2017-08-16 DIAGNOSIS — O09899 Supervision of other high risk pregnancies, unspecified trimester: Secondary | ICD-10-CM

## 2017-08-16 DIAGNOSIS — O0990 Supervision of high risk pregnancy, unspecified, unspecified trimester: Secondary | ICD-10-CM

## 2017-08-16 MED ORDER — HYDROXYPROGESTERONE CAPROATE 250 MG/ML IM OIL
250.0000 mg | TOPICAL_OIL | Freq: Once | INTRAMUSCULAR | Status: AC
  Administered 2017-08-16: 250 mg via INTRAMUSCULAR

## 2017-08-16 NOTE — Progress Notes (Incomplete)
Routine Prenatal Care Visit  Subjective  Jamie Weber is a 32 y.o. [id number] at [redacted]w[redacted]d being seen today for ongoing prenatal care.  She is currently monitored for the following issues for this {Blank single:19197::"high-risk","low-risk"} pregnancy and has Hormonal contraceptive; Allergy; History of migraine headaches; Pap smear for cervical cancer screening; Sciatica of left side; Generalized anxiety disorder; Encounter for preventive health examination; Supervision of high risk pregnancy, antepartum; History of cesarean delivery; and History of preterm delivery, currently pregnant on their problem list.  ----------------------------------------------------------------------------------- Patient reports {sx:14538}.   Contractions: Not present. Vag. Bleeding: None.  Movement: Present. Denies leaking of fluid.  ----------------------------------------------------------------------------------- The following portions of the patient's history were reviewed and updated as appropriate: allergies, current medications, past family history, past medical history, past social history, past surgical history and problem list. Problem list updated.   Objective  Blood pressure 112/66, weight 225 lb (102.1 kg), last menstrual period 01/18/2017. Pregravid weight 255 lb (115.7 kg) Total Weight Gain -30 lb (-13.6 kg) Urinalysis:      Fetal Status: Fetal Heart Rate (bpm): 140 Fundal Height: 31 cm Movement: Present     General:  Alert, oriented and cooperative. Patient is in no acute distress.  Skin: Skin is warm and dry. No rash noted.   Cardiovascular: Normal heart rate noted  Respiratory: Normal respiratory effort, no problems with respiration noted  Abdomen: Soft, gravid, appropriate for gestational age. Pain/Pressure: Absent     Pelvic:  {Blank single:19197::"Cervical exam performed","Cervical exam deferred"}        Extremities: Normal range of motion.  Edema: None  Mental Status: Normal mood and affect.  Normal behavior. Normal judgment and thought content.   Assessment   32 y.o. [id number] at [redacted]w[redacted]d by  10/25/2017, by Last Menstrual Period presenting for {Blank single:19197::"routine","work-in"} prenatal visit  Plan   Pregnancy # 4 Problems (from 03/02/17 to present)    Problem Noted Resolved   History of preterm delivery, currently pregnant 04/04/2017 by Farrel Conners, CNM No   Overview Signed 05/08/2017  9:13 AM by Farrel Conners, CNM    Preterm delivery at 33 weeks with G2      Supervision of high risk pregnancy, antepartum 03/02/2017 by Oswaldo Conroy, CNM No   Overview Addendum 08/16/2017  9:39 AM by Conard Novak, MD    Clinic [facility] Prenatal Labs  Dating LMP = 7wk [place] Blood type: --/--/A NEG  (01/03 0457)   Genetic Screen 1 Screen:    AFP:     Quad:     NIPS: Antibody:NEG (01/03 0457)  Anatomic [place]  Normal XX Rubella: 1.40 (12/05 1126)  Varicella:  Immune  GTT Early:  110              Third trimester: 118 RPR: Non Reactive (12/05 1126)   Rhogam  HBsAg: Negative (12/05 1126)   TDaP vaccine                        Flu Shot: 04/11/17 HIV: Non Reactive (12/05 1126)   Baby Food     Bottle                           GBS:   Contraception  Tubal  sign papers Pap: 2016 NIL, will need postpartum  CBB   Given information   CS/VBAC  undecided leaning towards c/s with tubal    Support Person  History of cesarean delivery 03/02/2017 by Oswaldo Conroy, CNM No   Overview Signed 05/08/2017  9:13 AM by Farrel Conners, CNM    For failure to descend           {Blank single:19197::"Term","Preterm"} labor symptoms and general obstetric precautions including but not limited to vaginal bleeding, contractions, leaking of fluid and fetal movement were reviewed in detail with the patient. Please refer to After Visit Summary for other counseling recommendations.   Return in about 2 weeks (around 08/30/2017) for Routine Prenatal Appointment (one week for progesterone  shot).  Thomasene Mohair, MD, [facility], [facility] 08/16/2017 9:53 AM

## 2017-08-16 NOTE — Progress Notes (Signed)
Routine Prenatal Care Visit  Subjective  Jamie Weber is a 32 y.o. [id number] at [redacted]w[redacted]d being seen today for ongoing prenatal care.  She is currently monitored for the following issues for this high-risk pregnancy and has Hormonal contraceptive; Allergy; History of migraine headaches; Pap smear for cervical cancer screening; Sciatica of left side; Generalized anxiety disorder; Encounter for preventive health examination; Supervision of high risk pregnancy, antepartum; History of cesarean delivery; and History of preterm delivery, currently pregnant on their problem list.  ----------------------------------------------------------------------------------- Patient reports no complaints.   Contractions: Not present. Vag. Bleeding: None.  Movement: Present. Denies leaking of fluid.  ----------------------------------------------------------------------------------- The following portions of the patient's history were reviewed and updated as appropriate: allergies, current medications, past family history, past medical history, past social history, past surgical history and problem list. Problem list updated.   Objective  Blood pressure 112/66, weight 225 lb (102.1 kg), last menstrual period 01/18/2017. Pregravid weight 255 lb (115.7 kg) Total Weight Gain -30 lb (-13.6 kg) Urinalysis:      Fetal Status: Fetal Heart Rate (bpm): 140 Fundal Height: 31 cm Movement: Present     General:  Alert, oriented and cooperative. Patient is in no acute distress.  Skin: Skin is warm and dry. No rash noted.   Cardiovascular: Normal heart rate noted  Respiratory: Normal respiratory effort, no problems with respiration noted  Abdomen: Soft, gravid, appropriate for gestational age. Pain/Pressure: Absent     Pelvic:  Cervical exam deferred        Extremities: Normal range of motion.  Edema: None  Mental Status: Normal mood and affect. Normal behavior. Normal judgment and thought content.   Assessment   32 y.o.  [name] at [redacted]w[redacted]d by  10/25/2017, by Last Menstrual Period presenting for routine prenatal visit  Plan   Pregnancy # 4 Problems (from 03/02/17 to present)    Problem Noted Resolved   History of preterm delivery, currently pregnant 04/04/2017 by Farrel Conners, CNM No   Overview Signed 05/08/2017  9:13 AM by Farrel Conners, CNM    Preterm delivery at 33 weeks with G2      Supervision of high risk pregnancy, antepartum 03/02/2017 by Oswaldo Conroy, CNM No   Overview Addendum 08/16/2017  9:39 AM by Conard Novak, MD    Clinic [facility] Prenatal Labs  Dating LMP = 7wk [place] Blood type: --/--/A NEG  (01/03 0457)   Genetic Screen 1 Screen:    AFP:     Quad:     NIPS: Antibody:NEG (01/03 0457)  Anatomic [place]  Normal XX Rubella: 1.40 (12/05 1126)  Varicella:  Immune  GTT Early:  110              Third trimester: 118 RPR: Non Reactive (12/05 1126)   Rhogam  HBsAg: Negative (12/05 1126)   TDaP vaccine                        Flu Shot: 04/11/17 HIV: Non Reactive (12/05 1126)   Baby Food     Bottle                           GBS:   Contraception  Tubal  sign papers Pap: 2016 NIL, will need postpartum  CBB   Given information   CS/VBAC  undecided leaning towards c/s with tubal    Support Person  History of cesarean delivery 03/02/2017 by Oswaldo Conroy, CNM No   Overview Signed 05/08/2017  9:13 AM by Farrel Conners, CNM    For failure to descend           Preterm labor symptoms and general obstetric precautions including but not limited to vaginal bleeding, contractions, leaking of fluid and fetal movement were reviewed in detail with the patient. Please refer to After Visit Summary for other counseling recommendations.   Return in about 2 weeks (around 08/30/2017) for Routine Prenatal Appointment (one week for progesterone shot).  Jamie Mohair, MD, [facility], [facility] 08/16/2017 9:53 AM

## 2017-08-17 ENCOUNTER — Ambulatory Visit: Payer: Medicaid Other

## 2017-08-24 ENCOUNTER — Ambulatory Visit (INDEPENDENT_AMBULATORY_CARE_PROVIDER_SITE_OTHER): Payer: Medicaid Other

## 2017-08-24 DIAGNOSIS — Z8751 Personal history of pre-term labor: Secondary | ICD-10-CM | POA: Diagnosis not present

## 2017-08-24 DIAGNOSIS — Z3A31 31 weeks gestation of pregnancy: Secondary | ICD-10-CM

## 2017-08-24 MED ORDER — HYDROXYPROGESTERONE CAPROATE 250 MG/ML IM OIL
250.0000 mg | TOPICAL_OIL | Freq: Once | INTRAMUSCULAR | Status: AC
  Administered 2017-08-24: 250 mg via INTRAMUSCULAR

## 2017-08-30 ENCOUNTER — Encounter: Payer: Self-pay | Admitting: Obstetrics and Gynecology

## 2017-08-30 ENCOUNTER — Ambulatory Visit (INDEPENDENT_AMBULATORY_CARE_PROVIDER_SITE_OTHER): Payer: Medicaid Other | Admitting: Obstetrics and Gynecology

## 2017-08-30 VITALS — BP 112/62 | Wt 226.0 lb

## 2017-08-30 DIAGNOSIS — Z3A32 32 weeks gestation of pregnancy: Secondary | ICD-10-CM

## 2017-08-30 DIAGNOSIS — O09219 Supervision of pregnancy with history of pre-term labor, unspecified trimester: Secondary | ICD-10-CM | POA: Diagnosis not present

## 2017-08-30 DIAGNOSIS — O09899 Supervision of other high risk pregnancies, unspecified trimester: Secondary | ICD-10-CM

## 2017-08-30 DIAGNOSIS — O0990 Supervision of high risk pregnancy, unspecified, unspecified trimester: Secondary | ICD-10-CM

## 2017-08-30 MED ORDER — HYDROXYPROGESTERONE CAPROATE 250 MG/ML IM OIL
250.0000 mg | TOPICAL_OIL | Freq: Once | INTRAMUSCULAR | Status: AC
  Administered 2017-08-30: 250 mg via INTRAMUSCULAR

## 2017-08-30 NOTE — Progress Notes (Signed)
Routine Prenatal Care Visit  Subjective  Jamie Weber is a 32 y.o. [id number] at [redacted]w[redacted]d being seen today for ongoing prenatal care.  She is currently monitored for the following issues for this high-risk pregnancy and has Hormonal contraceptive; Allergy; History of migraine headaches; Pap smear for cervical cancer screening; Sciatica of left side; Generalized anxiety disorder; Encounter for preventive health examination; Supervision of high risk pregnancy, antepartum; History of cesarean delivery; and History of preterm delivery, currently pregnant on their problem list.  ----------------------------------------------------------------------------------- Patient reports no complaints.   Contractions: Not present. Vag. Bleeding: None.  Movement: Present. Denies leaking of fluid.  ----------------------------------------------------------------------------------- The following portions of the patient's history were reviewed and updated as appropriate: allergies, current medications, past family history, past medical history, past social history, past surgical history and problem list. Problem list updated.   Objective  Blood pressure 112/62, weight 226 lb (102.5 kg), last menstrual period 01/18/2017. Pregravid weight 255 lb (115.7 kg) Total Weight Gain -29 lb (-13.2 kg) Urinalysis:      Fetal Status: Fetal Heart Rate (bpm): 145 Fundal Height: 33 cm Movement: Present     General:  Alert, oriented and cooperative. Patient is in no acute distress.  Skin: Skin is warm and dry. No rash noted.   Cardiovascular: Normal heart rate noted  Respiratory: Normal respiratory effort, no problems with respiration noted  Abdomen: Soft, gravid, appropriate for gestational age. Pain/Pressure: Absent     Pelvic:  Cervical exam deferred        Extremities: Normal range of motion.  Edema: None  Mental Status: Normal mood and affect. Normal behavior. Normal judgment and thought content.   Assessment   32 y.o.  [id number] at [redacted]w[redacted]d by  10/25/2017, by Last Menstrual Period presenting for routine prenatal visit  Plan   Pregnancy # 4 Problems (from 03/02/17 to present)    Problem Noted Resolved   History of preterm delivery, currently pregnant 04/04/2017 by Farrel Conners, CNM No   Overview Signed 05/08/2017  9:13 AM by Farrel Conners, CNM    Preterm delivery at 33 weeks with G2      Supervision of high risk pregnancy, antepartum 03/02/2017 by Oswaldo Conroy, CNM No   Overview Addendum 08/16/2017  9:39 AM by Conard Novak, MD    Clinic [facility] Prenatal Labs  Dating LMP = 7wk [place] Blood type: --/--/A NEG  (01/03 0457)   Genetic Screen 1 Screen:    AFP:     Quad:     NIPS: Antibody:NEG (01/03 0457)  Anatomic [place]  Normal XX Rubella: 1.40 (12/05 1126)  Varicella:  Immune  GTT Early:  110              Third trimester: 118 RPR: Non Reactive (12/05 1126)   Rhogam  HBsAg: Negative (12/05 1126)   TDaP vaccine                        Flu Shot: 04/11/17 HIV: Non Reactive (12/05 1126)   Baby Food     Bottle                           GBS:   Contraception  Tubal  sign papers Pap: 2016 NIL, will need postpartum  CBB   Given information   CS/VBAC  undecided leaning towards c/s with tubal    Support Person  History of cesarean delivery 03/02/2017 by Oswaldo Conroy, CNM No   Overview Signed 05/08/2017  9:13 AM by Farrel Conners, CNM    For failure to descend           Preterm labor symptoms and general obstetric precautions including but not limited to vaginal bleeding, contractions, leaking of fluid and fetal movement were reviewed in detail with the patient. Please refer to After Visit Summary for other counseling recommendations.   17OHP today.  Return in about 2 weeks (around 09/13/2017) for Routine Prenatal Appointment, keep weekly 17OHP injection appts.  Thomasene Mohair, MD, [facility], [facility] 08/30/2017 10:09 AM

## 2017-09-07 ENCOUNTER — Ambulatory Visit: Payer: Medicaid Other

## 2017-09-07 ENCOUNTER — Ambulatory Visit (INDEPENDENT_AMBULATORY_CARE_PROVIDER_SITE_OTHER): Payer: Medicaid Other

## 2017-09-07 DIAGNOSIS — O09219 Supervision of pregnancy with history of pre-term labor, unspecified trimester: Principal | ICD-10-CM

## 2017-09-07 DIAGNOSIS — Z3A33 33 weeks gestation of pregnancy: Secondary | ICD-10-CM

## 2017-09-07 DIAGNOSIS — O09899 Supervision of other high risk pregnancies, unspecified trimester: Secondary | ICD-10-CM

## 2017-09-07 DIAGNOSIS — O09213 Supervision of pregnancy with history of pre-term labor, third trimester: Secondary | ICD-10-CM | POA: Diagnosis not present

## 2017-09-07 MED ORDER — HYDROXYPROGESTERONE CAPROATE 250 MG/ML IM OIL
250.0000 mg | TOPICAL_OIL | Freq: Once | INTRAMUSCULAR | Status: AC
  Administered 2017-09-07: 250 mg via INTRAMUSCULAR

## 2017-09-14 ENCOUNTER — Encounter: Payer: Self-pay | Admitting: Obstetrics and Gynecology

## 2017-09-14 ENCOUNTER — Ambulatory Visit (INDEPENDENT_AMBULATORY_CARE_PROVIDER_SITE_OTHER): Payer: Medicaid Other | Admitting: Obstetrics and Gynecology

## 2017-09-14 VITALS — BP 110/60 | Wt 228.0 lb

## 2017-09-14 DIAGNOSIS — O99213 Obesity complicating pregnancy, third trimester: Secondary | ICD-10-CM

## 2017-09-14 DIAGNOSIS — Z6841 Body Mass Index (BMI) 40.0 and over, adult: Secondary | ICD-10-CM | POA: Insufficient documentation

## 2017-09-14 DIAGNOSIS — R109 Unspecified abdominal pain: Secondary | ICD-10-CM

## 2017-09-14 DIAGNOSIS — Z98891 History of uterine scar from previous surgery: Secondary | ICD-10-CM

## 2017-09-14 DIAGNOSIS — O99210 Obesity complicating pregnancy, unspecified trimester: Secondary | ICD-10-CM | POA: Insufficient documentation

## 2017-09-14 DIAGNOSIS — O09219 Supervision of pregnancy with history of pre-term labor, unspecified trimester: Secondary | ICD-10-CM

## 2017-09-14 DIAGNOSIS — O0990 Supervision of high risk pregnancy, unspecified, unspecified trimester: Secondary | ICD-10-CM

## 2017-09-14 DIAGNOSIS — Z3A34 34 weeks gestation of pregnancy: Secondary | ICD-10-CM

## 2017-09-14 DIAGNOSIS — O09899 Supervision of other high risk pregnancies, unspecified trimester: Secondary | ICD-10-CM

## 2017-09-14 LAB — POCT URINALYSIS DIPSTICK
Bilirubin, UA: NEGATIVE
Blood, UA: NEGATIVE
Glucose, UA: NEGATIVE
NITRITE UA: NEGATIVE
PH UA: 6.5 (ref 5.0–8.0)
PROTEIN UA: POSITIVE — AB
Spec Grav, UA: 1.015 (ref 1.010–1.025)
UROBILINOGEN UA: NEGATIVE U/dL — AB

## 2017-09-14 MED ORDER — HYDROXYPROGESTERONE CAPROATE 250 MG/ML IM OIL
250.0000 mg | TOPICAL_OIL | Freq: Once | INTRAMUSCULAR | Status: AC
  Administered 2017-09-14: 250 mg via INTRAMUSCULAR

## 2017-09-14 NOTE — Addendum Note (Signed)
Addended by: Thomasene MohairJACKSON, Matha Masse D on: 09/14/2017 10:24 AM   Modules accepted: Orders

## 2017-09-14 NOTE — Progress Notes (Signed)
Routine Prenatal Care Visit  Subjective  Jamie Weber is a 32 y.o. [id number] at [redacted]w[redacted]d being seen today for ongoing prenatal care.  She is currently monitored for the following issues for this high-risk pregnancy and has Hormonal contraceptive; Allergy; History of migraine headaches; Pap smear for cervical cancer screening; Sciatica of left side; Generalized anxiety disorder; Encounter for preventive health examination; Supervision of high risk pregnancy, antepartum; History of cesarean delivery; History of preterm delivery, currently pregnant; Obesity affecting pregnancy; and BMI 40.0-44.9, adult (HCC) on their problem list.  ----------------------------------------------------------------------------------- Patient reports pelvic pressure (see UA).  No urinary sx, no vaginal symptoms.   Contractions: Not present. Vag. Bleeding: None.  Movement: Present. Denies leaking of fluid.  ----------------------------------------------------------------------------------- The following portions of the patient's history were reviewed and updated as appropriate: allergies, current medications, past family history, past medical history, past social history, past surgical history and problem list. Problem list updated.   Objective  Blood pressure 110/60, weight 228 lb (103.4 kg), last menstrual period 01/18/2017. Pregravid weight 255 lb (115.7 kg) Total Weight Gain -27 lb (-12.2 kg) Urinalysis:      Fetal Status: Fetal Heart Rate (bpm): 140 Fundal Height: 35 cm Movement: Present     General:  Alert, oriented and cooperative. Patient is in no acute distress.  Skin: Skin is warm and dry. No rash noted.   Cardiovascular: Normal heart rate noted  Respiratory: Normal respiratory effort, no problems with respiration noted  Abdomen: Soft, gravid, appropriate for gestational age. Pain/Pressure: Absent     Pelvic:  Cervical exam deferred        Extremities: Normal range of motion.  Edema: None  Mental Status:  Normal mood and affect. Normal behavior. Normal judgment and thought content.   Assessment   32 y.o. [id number] at [redacted]w[redacted]d by  10/25/2017, by Last Menstrual Period presenting for routine prenatal visit  Plan   Pregnancy # 4 Problems (from 03/02/17 to present)    Problem Noted Resolved   Obesity affecting pregnancy 09/14/2017 by Conard Novak, MD No   BMI 40.0-44.9, adult [facility] 09/14/2017 by Conard Novak, MD No   History of preterm delivery, currently pregnant 04/04/2017 by Farrel Conners, CNM No   Overview Signed 05/08/2017  9:13 AM by Farrel Conners, CNM    Preterm delivery at 33 weeks with G2      Supervision of high risk pregnancy, antepartum 03/02/2017 by Oswaldo Conroy, CNM No   Overview Addendum 08/16/2017  9:39 AM by Conard Novak, MD    [facility]inic [facility] Prenatal Labs  Dating LMP = 7wk [place] Blood type: --/--/A NEG  (01/03 0457)   Genetic Screen 1 Screen:    AFP:     Quad:     NIPS: Antibody:NEG (01/03 0457)  Anatomic [place]  Normal XX Rubella: 1.40 (12/05 1126)  Varicella:  Immune  GTT Early:  110              Third trimester: 118 RPR: Non Reactive (12/05 1126)   Rhogam  HBsAg: Negative (12/05 1126)   TDaP vaccine                        Flu Shot: 04/11/17 HIV: Non Reactive (12/05 1126)   Baby Food     Bottle                           GBS:   Contraception  Tubal [ ]  sign  papers Pap: 2016 NIL, will need postpartum  CBB   Given information   CS/VBAC  undecided leaning towards c/s with tubal    Support Person              History of cesarean delivery 03/02/2017 by Oswaldo ConroySchmid, Jacelyn Y, CNM No   Overview Signed 05/08/2017  9:13 AM by Farrel ConnersGutierrez, Colleen, CNM    For failure to descend           Preterm labor symptoms and general obstetric precautions including but not limited to vaginal bleeding, contractions, leaking of fluid and fetal movement were reviewed in detail with the patient. Please refer to After Visit Summary for other counseling recommendations.   -  pressure symptoms. Declines pelvic. Will send urine for culture. - 17 OHP this visit and weekly until 36 weeks - growth u/s next visit - she will decide about c-section next visit.   Return in about 2 weeks (around 09/28/2017) for schedule u/s for growth/afi with Routine Prenatal Appointment/NST.  Thomasene MohairStephen Abeera Flannery, MD, Merlinda FrederickFACOG [facility], [facility] 09/14/2017 10:22 AM

## 2017-09-14 NOTE — Addendum Note (Signed)
Addended by: Loran SentersJOHNSON, Genifer Lazenby D on: 09/14/2017 10:36 AM   Modules accepted: Orders

## 2017-09-16 LAB — URINE CULTURE

## 2017-09-21 ENCOUNTER — Ambulatory Visit (INDEPENDENT_AMBULATORY_CARE_PROVIDER_SITE_OTHER): Payer: Medicaid Other

## 2017-09-21 ENCOUNTER — Ambulatory Visit: Payer: Medicaid Other

## 2017-09-21 DIAGNOSIS — O09213 Supervision of pregnancy with history of pre-term labor, third trimester: Secondary | ICD-10-CM

## 2017-09-21 DIAGNOSIS — O09893 Supervision of other high risk pregnancies, third trimester: Secondary | ICD-10-CM

## 2017-09-21 MED ORDER — HYDROXYPROGESTERONE CAPROATE 250 MG/ML IM OIL
250.0000 mg | TOPICAL_OIL | Freq: Once | INTRAMUSCULAR | Status: AC
  Administered 2017-09-21: 250 mg via INTRAMUSCULAR

## 2017-09-27 ENCOUNTER — Ambulatory Visit (INDEPENDENT_AMBULATORY_CARE_PROVIDER_SITE_OTHER): Payer: Medicaid Other | Admitting: Obstetrics and Gynecology

## 2017-09-27 ENCOUNTER — Encounter: Payer: Self-pay | Admitting: Obstetrics and Gynecology

## 2017-09-27 ENCOUNTER — Telehealth: Payer: Self-pay | Admitting: Obstetrics and Gynecology

## 2017-09-27 ENCOUNTER — Ambulatory Visit (INDEPENDENT_AMBULATORY_CARE_PROVIDER_SITE_OTHER): Payer: Medicaid Other

## 2017-09-27 VITALS — BP 110/70 | Wt 229.0 lb

## 2017-09-27 DIAGNOSIS — O99213 Obesity complicating pregnancy, third trimester: Secondary | ICD-10-CM

## 2017-09-27 DIAGNOSIS — Z98891 History of uterine scar from previous surgery: Secondary | ICD-10-CM

## 2017-09-27 DIAGNOSIS — Z3A36 36 weeks gestation of pregnancy: Secondary | ICD-10-CM | POA: Diagnosis not present

## 2017-09-27 DIAGNOSIS — O403XX0 Polyhydramnios, third trimester, not applicable or unspecified: Secondary | ICD-10-CM

## 2017-09-27 DIAGNOSIS — O34219 Maternal care for unspecified type scar from previous cesarean delivery: Secondary | ICD-10-CM

## 2017-09-27 DIAGNOSIS — Z6841 Body Mass Index (BMI) 40.0 and over, adult: Secondary | ICD-10-CM | POA: Diagnosis not present

## 2017-09-27 DIAGNOSIS — O09899 Supervision of other high risk pregnancies, unspecified trimester: Secondary | ICD-10-CM

## 2017-09-27 DIAGNOSIS — O09213 Supervision of pregnancy with history of pre-term labor, third trimester: Secondary | ICD-10-CM

## 2017-09-27 DIAGNOSIS — O0990 Supervision of high risk pregnancy, unspecified, unspecified trimester: Secondary | ICD-10-CM

## 2017-09-27 DIAGNOSIS — O09219 Supervision of pregnancy with history of pre-term labor, unspecified trimester: Secondary | ICD-10-CM

## 2017-09-27 NOTE — Progress Notes (Signed)
Pt c/o pelvic pressure, difficulty sleeping, eating. Pt declines last 17P injection.

## 2017-09-27 NOTE — Progress Notes (Signed)
Routine Prenatal Care Visit  Subjective  Jamie Weber is a 32 y.o. [id number] at [redacted]w[redacted]d being seen today for ongoing prenatal care.  She is currently monitored for the following issues for this high-risk pregnancy and has Hormonal contraceptive; Allergy; History of migraine headaches; Pap smear for cervical cancer screening; Sciatica of left side; Generalized anxiety disorder; Encounter for preventive health examination; Supervision of high risk pregnancy, antepartum; History of cesarean delivery; History of preterm delivery, currently pregnant; Obesity affecting pregnancy; BMI 40.0-44.9, adult (HCC); and Polyhydramnios in third trimester on their problem list.  ----------------------------------------------------------------------------------- Patient reports no complaints.   Contractions: Not present. Vag. Bleeding: None.  Movement: Present. Denies leaking of fluid.  ----------------------------------------------------------------------------------- The following portions of the patient's history were reviewed and updated as appropriate: allergies, current medications, past family history, past medical history, past social history, past surgical history and problem list. Problem list updated.   Objective  Blood pressure 110/70, weight 229 lb (103.9 kg), last menstrual period 01/18/2017. Pregravid weight 255 lb (115.7 kg) Total Weight Gain -26 lb (-11.8 kg) Urinalysis: Urine Protein: Trace Urine Glucose: Negative  Fetal Status: Fetal Heart Rate (bpm): 138 Fundal Height: 37 cm Movement: Present     General:  Alert, oriented and cooperative. Patient is in no acute distress.  Skin: Skin is warm and dry. No rash noted.   Cardiovascular: Normal heart rate noted  Respiratory: Normal respiratory effort, no problems with respiration noted  Abdomen: Soft, gravid, appropriate for gestational age. Pain/Pressure: Present     Pelvic:  Cervical exam performed Dilation: 1 Effacement (%): Thick Station:  -3  Extremities: Normal range of motion.  Edema: None  ental Status: Normal mood and affect. Normal behavior. Normal judgment and thought content.     Assessment   32 y.o. [id number] at [redacted]w[redacted]d by  10/25/2017, by Last Menstrual Period presenting for routine prenatal visit  Plan   Pregnancy # 4 Problems (from 03/02/17 to present)    Problem Noted Resolved   Obesity affecting pregnancy 09/14/2017 by Conard Novak, MD No   BMI 40.0-44.9, adult [facility] 09/14/2017 by Conard Novak, MD No   History of preterm delivery, currently pregnant 04/04/2017 by Farrel Conners, CNM No   Overview Signed 05/08/2017  9:13 AM by Farrel Conners, CNM    Preterm delivery at 33 weeks with G2      Supervision of high risk pregnancy, antepartum 03/02/2017 by Oswaldo Conroy, CNM No   Overview Addendum 08/16/2017  9:39 AM by Conard Novak, MD    [facility]inic [facility] Prenatal Labs  Dating LMP = 7wk [place] Blood type: --/--/A NEG  (01/03 0457)   Genetic Screen 1 Screen:    AFP:     Quad:     NIPS: Antibody:NEG (01/03 0457)  Anatomic [place]  Normal XX Rubella: 1.40 (12/05 1126)  Varicella:  Immune  GTT Early:  110              Third trimester: 118 RPR: Non Reactive (12/05 1126)   Rhogam  HBsAg: Negative (12/05 1126)   TDaP vaccine                        Flu Shot: 04/11/17 HIV: Non Reactive (12/05 1126)   Baby Food     Bottle                           GBS:   Contraception  Tubal [ ]   sign papers Pap: 2016 NIL, will need postpartum  CBB   Given information   CS/VBAC  undecided leaning towards c/s with tubal    Support Person              History of cesarean delivery 03/02/2017 by Oswaldo ConroySchmid, Jacelyn Y, CNM No   Overview Signed 05/08/2017  9:13 AM by Farrel ConnersGutierrez, Colleen, CNM    For failure to descend           Gestational age appropriate obstetric precautions including but not limited to vaginal bleeding, contractions, leaking of fluid and fetal movement were reviewed in detail with the patient.    Cesarean  section scheduled at 39 weeks by patient request. She is hoping she goes into labor before then. GBS and gonorrhea chlamydia testing today. Patient refused Makena. Return in about 1 week (around 10/04/2017) for ROB.  Adelene Idlerhristanna Finlay Mills MD [facility], [facility] 09/27/17 11:18 AM

## 2017-09-27 NOTE — Telephone Encounter (Signed)
-----   Message from Natale Milchhristanna R Schuman, MD sent at 09/27/2017 11:07 AM EDT ----- Surgery Booking Request Patient Full Name:  Jamie Weber  MRN: [id number]  DOB: 11/08/1985  Surgeon: Natale Milchhristanna R Schuman, MD  Requested Surgery Date and Time: October 18 2017 ( L&D okay) Primary Diagnosis AND Code: hx of prior LTCS Secondary Diagnosis and Code:  Surgical Procedure: Cesarean section L&D Notification: Yes Admission Status: surgery admit Length of Surgery: 1.5 hours Special Case Needs: none H&P:   (date) Phone Interview???: yes Interpreter: Language:  Medical Clearance: no Special Scheduling Instructions: none

## 2017-09-27 NOTE — Telephone Encounter (Signed)
Lmtrc

## 2017-09-28 NOTE — Telephone Encounter (Signed)
[facility] 9:40am w/ Dr. Tiburcio PeaHarris, Pre-admit Testing afterwards, and OR on 10/18/17. Patient requested that she be scheduled for 10/16/17, but Dr. Jerene PitchSchuman confirmed 10/18/17 was the earliest date patient can be scheduled. Patient requested Dr. Tiburcio PeaHarris on this date. Patient confirmed Medicaid and no other insurance. Ext given. Lori @ L&D notified.

## 2017-09-29 LAB — GC/CHLAMYDIA PROBE AMP
Chlamydia trachomatis, NAA: NEGATIVE
Neisseria gonorrhoeae by PCR: NEGATIVE

## 2017-09-30 LAB — CULTURE, BETA STREP (GROUP B ONLY): Strep Gp B Culture: POSITIVE — AB

## 2017-10-05 ENCOUNTER — Ambulatory Visit (INDEPENDENT_AMBULATORY_CARE_PROVIDER_SITE_OTHER): Payer: Medicaid Other | Admitting: Maternal Newborn

## 2017-10-05 ENCOUNTER — Encounter: Payer: Self-pay | Admitting: Maternal Newborn

## 2017-10-05 VITALS — BP 120/60 | Wt 235.0 lb

## 2017-10-05 DIAGNOSIS — O0990 Supervision of high risk pregnancy, unspecified, unspecified trimester: Secondary | ICD-10-CM

## 2017-10-05 DIAGNOSIS — Z3A37 37 weeks gestation of pregnancy: Secondary | ICD-10-CM

## 2017-10-05 NOTE — Progress Notes (Signed)
Routine Prenatal Care Visit  Subjective  Jamie Weber is a 32 y.o. [id number] at 268w1d being seen today for ongoing prenatal care.  She is currently monitored for the following issues for this high-risk pregnancy and has Hormonal contraceptive; Allergy; History of migraine headaches; Pap smear for cervical cancer screening; Sciatica of left side; Generalized anxiety disorder; Encounter for preventive health examination; Supervision of high risk pregnancy, antepartum; History of cesarean delivery; History of preterm delivery, currently pregnant; Obesity affecting pregnancy; BMI 40.0-44.9, adult (HCC); and Polyhydramnios in third trimester on their problem list.  ----------------------------------------------------------------------------------- Patient reports that her underwear were wet with a small amount of odorless fluid. She wonders if she is leaking fluid. She is feeling a lot of pain and pressure in her pelvis, especially in the area of her pubis symphysis.  Contractions: Not present. Vag. Bleeding: None.  Movement: Present. No leaking of fluid.  ----------------------------------------------------------------------------------- The following portions of the patient's history were reviewed and updated as appropriate: allergies, current medications, past family history, past medical history, past social history, past surgical history and problem list. Problem list updated.   Objective  Blood pressure 120/60, weight 235 lb (106.6 kg), last menstrual period 01/18/2017. Pregravid weight 255 lb (115.7 kg) Total Weight Gain -20 lb (-9.072 kg) Urinalysis: Urine Protein: Trace Urine Glucose: Negative  Fetal Status: Fetal Heart Rate (bpm): 143   Movement: Present     General:  Alert, oriented and cooperative. Patient is in no acute distress.  Skin: Skin is warm and dry. No rash noted.   Cardiovascular: Normal heart rate noted  Respiratory: Normal respiratory effort, no problems with  respiration noted  Abdomen: Soft, gravid, appropriate for gestational age. Pain/Pressure: Present     Pelvic:  Cervical exam performed Dilation: 1 Effacement (%): Thick Station: -2  Extremities: Normal range of motion.     Mental Status: Normal mood and affect. Normal behavior. Normal judgment and thought content.   Sterile speculum exam: pooling negative, nitrazine negative, ferning negative on microscopy  Assessment   32 y.o. [id number] at 4968w1d, EDD 10/25/2017 by Last Menstrual Period presenting for routine prenatal visit.  Plan   Pregnancy # 4 Problems (from 03/02/17 to present)    Problem Noted Resolved   Obesity affecting pregnancy 09/14/2017 by Conard NovakJackson, Stephen D, MD No   BMI 40.0-44.9, adult [facility](HCC) 09/14/2017 by Conard NovakJackson, Stephen D, MD No   History of preterm delivery, currently pregnant 04/04/2017 by Farrel ConnersGutierrez, Colleen, CNM No   Overview Signed 05/08/2017  9:13 AM by Farrel ConnersGutierrez, Colleen, CNM    Preterm delivery at 33 weeks with G2      Supervision of high risk pregnancy, antepartum 03/02/2017 by Oswaldo ConroySchmid, Jacelyn Y, CNM No   Overview Addendum 08/16/2017  9:39 AM by Conard NovakJackson, Stephen D, MD    [facility]inic [facility] Prenatal Labs  Dating LMP = 7wk US Blood type: --/--/A NEG  (01/03 0457)   Genetic Screen 1 Screen:    AFP:     Quad:     NIPS: Antibody:NEG (01/03 0457)  Anatomic US  Normal XX Rubella: 1.40 (12/05 1126)  Varicella:  Immune  GTT Early:  110              Third trimester: 118 RPR: Non Reactive (12/05 1126)   Rhogam  HBsAg: Negative (12/05 1126)   TDaP vaccine                        Flu Shot: 04/11/17 HIV: Non Reactive (12/05  1126)   Baby Food     Bottle                           GBS:   Contraception  Tubal [ ]  sign papers Pap: 2016 NIL, will need postpartum  CBB   Given information   CS/VBAC  undecided leaning towards c/s with tubal    Support Person              History of cesarean delivery 03/02/2017 by Oswaldo Conroy, CNM No   Overview Signed 05/08/2017  9:13 AM by  Farrel Conners, CNM    For failure to descend        All tests for ROM were negative today.  Term labor symptoms and general obstetric precautions including but not limited to vaginal bleeding, contractions, leaking of fluid and fetal movement were reviewed.  Return in about 1 week (around 10/12/2017) for ROB.  Marcelyn Bruins, CNM 10/05/2017  1:49 PM

## 2017-10-05 NOTE — Progress Notes (Signed)
C/o unable to put legs together, hard to walk, thinks she's leaking fluid.rj

## 2017-10-09 ENCOUNTER — Other Ambulatory Visit: Payer: Self-pay

## 2017-10-09 ENCOUNTER — Encounter: Payer: Self-pay | Admitting: *Deleted

## 2017-10-09 ENCOUNTER — Inpatient Hospital Stay: Payer: 59 | Admitting: Registered Nurse

## 2017-10-09 ENCOUNTER — Inpatient Hospital Stay
Admission: EM | Admit: 2017-10-09 | Discharge: 2017-10-11 | DRG: 807 | Disposition: A | Discharge status: Home / Self Care | Payer: 59 | Attending: Obstetrics and Gynecology | Admitting: Obstetrics and Gynecology

## 2017-10-09 DIAGNOSIS — O99214 Obesity complicating childbirth: Secondary | ICD-10-CM | POA: Diagnosis present

## 2017-10-09 DIAGNOSIS — O09219 Supervision of pregnancy with history of pre-term labor, unspecified trimester: Secondary | ICD-10-CM

## 2017-10-09 DIAGNOSIS — Z3A40 40 weeks gestation of pregnancy: Secondary | ICD-10-CM

## 2017-10-09 DIAGNOSIS — Z98891 History of uterine scar from previous surgery: Secondary | ICD-10-CM

## 2017-10-09 DIAGNOSIS — O0990 Supervision of high risk pregnancy, unspecified, unspecified trimester: Secondary | ICD-10-CM

## 2017-10-09 DIAGNOSIS — O34219 Maternal care for unspecified type scar from previous cesarean delivery: Secondary | ICD-10-CM | POA: Diagnosis present

## 2017-10-09 DIAGNOSIS — Z87891 Personal history of nicotine dependence: Secondary | ICD-10-CM

## 2017-10-09 DIAGNOSIS — Z3A37 37 weeks gestation of pregnancy: Secondary | ICD-10-CM

## 2017-10-09 DIAGNOSIS — Z6841 Body Mass Index (BMI) 40.0 and over, adult: Secondary | ICD-10-CM

## 2017-10-09 DIAGNOSIS — E669 Obesity, unspecified: Secondary | ICD-10-CM | POA: Diagnosis present

## 2017-10-09 DIAGNOSIS — O99824 Streptococcus B carrier state complicating childbirth: Secondary | ICD-10-CM | POA: Diagnosis present

## 2017-10-09 DIAGNOSIS — O480 Post-term pregnancy: Secondary | ICD-10-CM

## 2017-10-09 DIAGNOSIS — O09899 Supervision of other high risk pregnancies, unspecified trimester: Secondary | ICD-10-CM

## 2017-10-09 DIAGNOSIS — O403XX0 Polyhydramnios, third trimester, not applicable or unspecified: Secondary | ICD-10-CM | POA: Diagnosis present

## 2017-10-09 LAB — CBC
HEMATOCRIT: 39.2 % (ref 35.0–47.0)
HEMOGLOBIN: 13.4 g/dL (ref 12.0–16.0)
MCH: 30.7 pg (ref 26.0–34.0)
MCHC: 34 g/dL (ref 32.0–36.0)
MCV: 90.2 fL (ref 80.0–100.0)
PLATELETS: 176 10*3/uL (ref 150–440)
RBC: 4.35 MIL/uL (ref 3.80–5.20)
RDW: 13.2 % (ref 11.5–14.5)
WBC: 11.2 10*3/uL — AB (ref 3.6–11.0)

## 2017-10-09 MED ORDER — IBUPROFEN 600 MG PO TABS
600.0000 mg | ORAL_TABLET | Freq: Four times a day (QID) | ORAL | Status: DC
  Administered 2017-10-10 – 2017-10-11 (×6): 600 mg via ORAL
  Filled 2017-10-09 (×6): qty 1

## 2017-10-09 MED ORDER — IBUPROFEN 600 MG PO TABS
ORAL_TABLET | ORAL | Status: AC
  Administered 2017-10-10: 600 mg via ORAL
  Filled 2017-10-09: qty 1

## 2017-10-09 MED ORDER — LACTATED RINGERS IV SOLN: 500.0000 mL | INTRAVENOUS | Status: DC | PRN

## 2017-10-09 MED ORDER — LACTATED RINGERS IV SOLN
INTRAVENOUS | Status: DC
  Administered 2017-10-09 (×2): 1000 mL via INTRAVENOUS

## 2017-10-09 MED ORDER — FENTANYL 2.5 MCG/ML W/ROPIVACAINE 0.15% IN NS 100 ML EPIDURAL (ARMC)
EPIDURAL | Status: DC | PRN
  Administered 2017-10-09: 12 mL/h via EPIDURAL

## 2017-10-09 MED ORDER — MISOPROSTOL 200 MCG PO TABS
ORAL_TABLET | ORAL | Status: AC
  Administered 2017-10-09: 800 ug via RECTAL
  Filled 2017-10-09: qty 4

## 2017-10-09 MED ORDER — AMPICILLIN SODIUM 2 G IJ SOLR
2.0000 g | Freq: Once | INTRAMUSCULAR | Status: AC
  Administered 2017-10-09: 2 g via INTRAVENOUS
  Filled 2017-10-09: qty 2000

## 2017-10-09 MED ORDER — SODIUM CHLORIDE 0.9 % IV SOLN
1.0000 g | INTRAVENOUS | Status: DC
  Filled 2017-10-09 (×3): qty 1000

## 2017-10-09 MED ORDER — BUPIVACAINE HCL (PF) 0.25 % IJ SOLN
INTRAMUSCULAR | Status: DC | PRN
  Administered 2017-10-09 (×2): 4 mL via EPIDURAL

## 2017-10-09 MED ORDER — OXYTOCIN BOLUS FROM INFUSION
500.0000 mL | Freq: Once | INTRAVENOUS | Status: AC
  Administered 2017-10-09: 500 mL via INTRAVENOUS

## 2017-10-09 MED ORDER — FENTANYL CITRATE (PF) 100 MCG/2ML IJ SOLN
50.0000 ug | INTRAMUSCULAR | Status: DC | PRN
  Administered 2017-10-09 (×2): 100 ug via INTRAVENOUS
  Filled 2017-10-09 (×2): qty 2

## 2017-10-09 MED ORDER — LIDOCAINE HCL (PF) 1 % IJ SOLN
INTRAMUSCULAR | Status: DC | PRN
  Administered 2017-10-09: 3 mL

## 2017-10-09 MED ORDER — LIDOCAINE-EPINEPHRINE (PF) 1.5 %-1:200000 IJ SOLN
INTRAMUSCULAR | Status: DC | PRN
  Administered 2017-10-09: 3 mL via PERINEURAL

## 2017-10-09 MED ORDER — ONDANSETRON HCL 4 MG/2ML IJ SOLN: 4.0000 mg | Freq: Four times a day (QID) | INTRAMUSCULAR | Status: DC | PRN

## 2017-10-09 MED ORDER — OXYTOCIN 40 UNITS IN LACTATED RINGERS INFUSION - SIMPLE MED
2.5000 [IU]/h | INTRAVENOUS | Status: DC
  Filled 2017-10-09 (×2): qty 1000

## 2017-10-09 MED ORDER — SODIUM CHLORIDE 0.9 % IV SOLN
INTRAVENOUS | Status: AC
  Filled 2017-10-09: qty 1000

## 2017-10-09 MED ORDER — ACETAMINOPHEN 325 MG PO TABS: 650.0000 mg | ORAL_TABLET | ORAL | Status: DC | PRN

## 2017-10-09 MED ORDER — FENTANYL 2.5 MCG/ML W/ROPIVACAINE 0.15% IN NS 100 ML EPIDURAL (ARMC)
EPIDURAL | Status: AC
  Filled 2017-10-09: qty 100

## 2017-10-09 NOTE — H&P (Addendum)
Obstetrics Admission History & Physical   Contractions  HPI:  32 y.o. [id number] @ [redacted]w[redacted]d (10/25/2017, by Last Menstrual Period). Admitted on 10/09/2017:   Patient Active Problem List   Diagnosis Date Noted  . Vaginal birth after cesarean (VBAC) 10/09/2017  . Polyhydramnios in third trimester 09/27/2017  . Obesity affecting pregnancy 09/14/2017  . BMI 40.0-44.9, adult (HCC) 09/14/2017  . History of preterm delivery, currently pregnant 04/04/2017  . Supervision of high risk pregnancy, antepartum 03/02/2017  . History of cesarean delivery 03/02/2017  . Generalized anxiety disorder 08/07/2012  . Sciatica of left side 03/03/2011  . Allergy   . History of migraine headaches      Presents for painful contractions that began this morning at 0530. She denies vaginal bleeding and loss of fluid. She endorses good fetal movement.  [facility] at: at [facility].  Pregnancy complicated by Group B strep, history of cesarean delivery, polyhydramnios.  ROS: A review of systems was performed and negative, except as stated in the above HPI. PMHx:  Past Medical History:  Diagnosis Date  . Allergy    history of hives controoled with zyrtec  . Depression    PSHx:  Past Surgical History:  Procedure Laterality Date  . DILATION AND CURETTAGE OF UTERUS  Jun 2011   normal,. [place]  . LAPAROSCOPY  September 03, 2009   hysteroscopy and D&C, Dr. Harold Hedge   Medications:  Medications Prior to Admission  Medication Sig Dispense Refill Last Dose  . ondansetron (ZOFRAN ODT) 4 MG disintegrating tablet Take 1 tablet (4 mg total) by mouth every 6 (six) hours as needed for nausea. 20 tablet 2 Past Month at Unknown time  . Prenatal Vit-Fe Fumarate-FA (PRENATAL MULTIVITAMIN) TABS tablet Take 1 tablet by mouth daily at 12 noon.   10/08/2017 at Unknown time  . Doxylamine-Pyridoxine (DICLEGIS) 10-10 MG TBEC Take 2 tablets by mouth at bedtime. If symptoms persist, add one tablet in the morning and one in the afternoon (Patient  not taking: Reported on 06/08/2017) 100 tablet 5 Not Taking  . hydroxyprogesterone caproate (MAKENA) 250 mg/mL OIL injection Inject 250 mg intramuscularly each week from week 16-36 of pregnancy (Patient not taking: Reported on 10/09/2017) 5 mL 5 Not Taking at Unknown time   Allergies: has No Known Allergies. OBHx:  OB History  Gravida Para Term Preterm AB Living  4 2 1 1 1 2   SAB TAB Ectopic Multiple Live Births  1       2    # Outcome Date GA Lbr Len/2nd Weight Sex Delivery Anes PTL Lv  4 Current           3 Term 01/24/12 [redacted]w[redacted]d  8 lb 4.8 oz (3.765 kg) F CS-Unspec   LIV     Complications: Failure to Progress in Second Stage  2 Preterm 11/28/08 [redacted]w[redacted]d  4 lb 8 oz (2.041 kg) F Vag-Spont  Y LIV  1 SAB 07/11/06           JYN:WGNFA than listed in HPI remarkable for: Hypothyroidism in her father, breast cancer in her maternal grandmother, hyperlipidemia in her paternal grandmother, and heart disease in the paternal grandfather.  Family history of birth defects: heart defect in her paternal aunt, deceased at age 40 and lung defect in her paternal great uncle, deceased in infancy. Soc Hx: Alcohol: occasional prior to pregnancy; Drug use: None; Former smoker, quit in 2012.  Objective:   Vitals:   10/09/17 1004  BP: 129/79  Pulse: 75  Resp: 18  Temp: 98.3 F (36.8 C)   Constitutional: Well nourished, well developed female in no acute distress.  HEENT: normal Skin: Warm and dry.  Cardiovascular: Regular rate and rhythm, no murmur, rub, or gallop.   Extremity: trace to 1+ bilateral pedal edema Respiratory: Clear to auscultation bilaterally.  Normal respiratory effort Abdomen: gravis, moderate Neuro: Cranial nerves grossly intact Psych: Alert and Oriented x3. No memory deficits. Normal mood and affect.  MS: normal gait, normal bilateral lower extremity ROM/strength/stability.  Pelvic exam: is not limited by body habitus External Genitalia, Bartholin's glands, Urethra, Skene's glands: within  normal limits Vagina: within normal limits and with no blood in the vault Cervix: 4/90/-2; change to 5/90/-2 after one hour of ambulation Uterus: Spontaneous uterine activity  Adnexa: not evaluated  EFM: FHR:130 bpm, variability: moderate, accelerations: Present,  decelerations: Absent Toco: Frequency: Every 2-3 minutes, Duration: 50-60 seconds and Intensity: moderate   Perinatal info:  Blood type: A negative Rubella - Immune Varicella - Immune TDaP Given during third trimester of this pregnancy: 08/16/2017 RPR NR / HIV Neg/ HBsAg Neg  GBS Positive  Assessment & Plan:   32 y.o. [id number] @ 6782w5d, Admitted on 10/09/2017: in labor, desires vaginal birth after Cesarean, polyhydramnios.    Admit for labor, Antibiotics for GBS prophylaxis, Observe for cervical change, Fetal Wellbeing Reassuring and AROM when Appropriate  Marcelyn BruinsJacelyn Schmid, CNM [facility], [facility] 10/09/2017  11:49 AM   Total weight gain this pregnancy -20lbs, Body mass index is 39.11 kg/m.,1-hr OGTT test 118.  Pelvis tested to 4lbs 8oz with C-section in G2 secondary to second stage arrest, fetal intolerance to labor, and failed vacuum, low transverse single layer closure.  Last vaginal delivery 9 years ago. 36 week growth 6lbs 15oz 72%ile  and polyhydramnios of 29.4cm.   VBAC calculator chance of success 60.9%

## 2017-10-09 NOTE — Anesthesia Procedure Notes (Signed)
Epidural Patient location during procedure: OB Start time: 10/09/2017 3:19 PM End time: 10/09/2017 3:23 PM  Staffing Anesthesiologist: Berdine Addisonhomas, Mathai, MD Resident/CRNA: Stormy Fabianurtis, Clydette Privitera, CRNA Performed: resident/CRNA   Preanesthetic Checklist Completed: patient identified, site marked, surgical consent, pre-op evaluation, timeout performed, IV checked, risks and benefits discussed and monitors and equipment checked  Epidural Patient position: sitting Prep: ChloraPrep Patient monitoring: heart rate, continuous pulse ox and blood pressure Approach: midline Location: L3-L4 Injection technique: LOR saline  Needle:  Needle type: Tuohy  Needle gauge: 17 G Needle length: 9 cm and 9 Needle insertion depth: 8 cm Catheter type: closed end flexible Catheter size: 19 Gauge Catheter at skin depth: 13 cm Test dose: negative and 1.5% lidocaine with Epi 1:200 K  Assessment Sensory level: T10 Events: blood not aspirated, injection not painful, no injection resistance, negative IV test and no paresthesia  Additional Notes x1 attempt Pt. Evaluated and documentation done after procedure finished. Patient identified. Risks/Benefits/Options discussed with patient including but not limited to bleeding, infection, nerve damage, paralysis, failed block, incomplete pain control, headache, blood pressure changes, nausea, vomiting, reactions to medication both or allergic, itching and postpartum back pain. Confirmed with bedside nurse the patient's most recent platelet count. Confirmed with patient that they are not currently taking any anticoagulation, have any bleeding history or any family history of bleeding disorders. Patient expressed understanding and wished to proceed. All questions were answered. Sterile technique was used throughout the entire procedure. Please see nursing notes for vital signs. Test dose was given through epidural catheter and negative prior to continuing to dose epidural or start  infusion. Warning signs of high block given to the patient including shortness of breath, tingling/numbness in hands, complete motor block, or any concerning symptoms with instructions to call for help. Patient was given instructions on fall risk and not to get out of bed. All questions and concerns addressed with instructions to call with any issues or inadequate analgesia.   Patient tolerated the insertion well without immediate complications.Reason for block:procedure for pain

## 2017-10-09 NOTE — OB Triage Note (Signed)
Presents with complaint of contractions since 530am.  Denies any bloody show or leaking of fluid.

## 2017-10-09 NOTE — Discharge Summary (Signed)
Obstetric Discharge Summary Reason for Admission: onset of labor Prenatal Procedures: none Intrapartum Procedures: VBAC Postpartum Procedures: none Complications-Operative and Postpartum: none Hemoglobin  Date Value Ref Range Status  10/10/2017 9.5 (L) 12.0 - 16.0 g/dL Final    Comment:    RESULT REPEATED AND VERIFIED  08/01/2017 11.6 11.1 - 15.9 g/dL Final   HCT  Date Value Ref Range Status  10/10/2017 28.0 (L) 35.0 - 47.0 % Final   Hematocrit  Date Value Ref Range Status  08/01/2017 36.7 34.0 - 46.6 % Final    Physical Exam:  General: alert, cooperative and no distress Lochia: appropriate Uterine Fundus: firm Incision: NA DVT Evaluation: No evidence of DVT seen on physical exam.  Discharge Diagnoses: Term Pregnancy-delivered  Discharge Information: Date: 10/11/2017 Activity: pelvic rest and postpartum limited activity Diet: routine Medications: PNV Condition: stable Instructions: refer to practice specific booklet Discharge to: home Follow-up Information    Vena AustriaStaebler, Andreas, MD Follow up in 6 week(s).   Specialty:  Obstetrics and Gynecology Why:  postpartum visit Contact information: [address] [place] [address] [phone]           Newborn Data: Live born female  Birth Weight: 3570 g APGAR: 7, 8  Newborn Delivery   Birth date/time:  10/09/2017 19:39:00 Delivery type:  VBAC, Spontaneous     Home with mother.  Tresea MallJane Ellicia Alix, CNM 10/11/2017, 10:11 AM

## 2017-10-09 NOTE — Anesthesia Preprocedure Evaluation (Addendum)
Anesthesia Evaluation  Patient identified by MRN, date of birth, ID band Patient awake    Reviewed: Allergy & Precautions, H&P , NPO status , Patient's Chart, lab work & pertinent test results  History of Anesthesia Complications Negative for: history of anesthetic complications  Airway Mallampati: II  TM Distance: >3 FB     Dental  (+) Poor Dentition   Pulmonary former smoker,    Pulmonary exam normal        Cardiovascular negative cardio ROS Normal cardiovascular exam     Neuro/Psych Depression    GI/Hepatic negative GI ROS, Neg liver ROS,   Endo/Other  negative endocrine ROS  Renal/GU negative Renal ROS     Musculoskeletal   Abdominal   Peds  Hematology negative hematology ROS (+)   Anesthesia Other Findings Past Medical History: No date: Allergy     Comment:  history of hives controoled with zyrtec No date: Depression  Reproductive/Obstetrics (+) Pregnancy                            Anesthesia Physical Anesthesia Plan  ASA: II  Anesthesia Plan: Epidural   Post-op Pain Management:    Induction:   PONV Risk Score and Plan:   Airway Management Planned:   Additional Equipment:   Intra-op Plan:   Post-operative Plan:   Informed Consent: I have reviewed the patients History and Physical, chart, labs and discussed the procedure including the risks, benefits and alternatives for the proposed anesthesia with the patient or authorized representative who has indicated his/her understanding and acceptance.     Plan Discussed with: Anesthesiologist  Anesthesia Plan Comments:         Anesthesia Quick Evaluation

## 2017-10-10 ENCOUNTER — Encounter: Payer: Medicaid Other | Admitting: Obstetrics and Gynecology

## 2017-10-10 LAB — TYPE AND SCREEN
ABO/RH(D): A NEG
ANTIBODY SCREEN: POSITIVE
Unit division: 0
Unit division: 0

## 2017-10-10 LAB — RPR: RPR: NONREACTIVE

## 2017-10-10 LAB — CBC
HEMATOCRIT: 28 % — AB (ref 35.0–47.0)
HEMOGLOBIN: 9.5 g/dL — AB (ref 12.0–16.0)
MCH: 30.9 pg (ref 26.0–34.0)
MCHC: 33.9 g/dL (ref 32.0–36.0)
MCV: 91.2 fL (ref 80.0–100.0)
Platelets: 136 10*3/uL — ABNORMAL LOW (ref 150–440)
RBC: 3.07 MIL/uL — ABNORMAL LOW (ref 3.80–5.20)
RDW: 13.6 % (ref 11.5–14.5)
WBC: 12.3 10*3/uL — ABNORMAL HIGH (ref 3.6–11.0)

## 2017-10-10 LAB — BPAM RBC
BLOOD PRODUCT EXPIRATION DATE: 201908042359
BLOOD PRODUCT EXPIRATION DATE: 201908052359
UNIT TYPE AND RH: 600
Unit Type and Rh: 600

## 2017-10-10 MED ORDER — FAMOTIDINE 20 MG PO TABS
20.0000 mg | ORAL_TABLET | Freq: Two times a day (BID) | ORAL | Status: DC | PRN
  Administered 2017-10-11: 20 mg via ORAL
  Filled 2017-10-10: qty 1

## 2017-10-10 MED ORDER — SIMETHICONE 80 MG PO CHEW: 80.0000 mg | CHEWABLE_TABLET | ORAL | Status: DC | PRN

## 2017-10-10 MED ORDER — DIPHENHYDRAMINE HCL 25 MG PO CAPS: 25.0000 mg | ORAL_CAPSULE | Freq: Four times a day (QID) | ORAL | Status: DC | PRN

## 2017-10-10 MED ORDER — ACETAMINOPHEN 325 MG PO TABS: 650.0000 mg | ORAL_TABLET | ORAL | Status: DC | PRN

## 2017-10-10 MED ORDER — OXYCODONE-ACETAMINOPHEN 5-325 MG PO TABS: 2.0000 | ORAL_TABLET | ORAL | Status: DC | PRN

## 2017-10-10 MED ORDER — WITCH HAZEL-GLYCERIN EX PADS: 1.0000 "application " | MEDICATED_PAD | CUTANEOUS | Status: DC | PRN

## 2017-10-10 MED ORDER — OXYCODONE-ACETAMINOPHEN 5-325 MG PO TABS
1.0000 | ORAL_TABLET | ORAL | Status: DC | PRN
  Administered 2017-10-10: 1 via ORAL
  Filled 2017-10-10: qty 1

## 2017-10-10 MED ORDER — SENNOSIDES-DOCUSATE SODIUM 8.6-50 MG PO TABS
2.0000 | ORAL_TABLET | ORAL | Status: DC
  Administered 2017-10-10 – 2017-10-11 (×2): 2 via ORAL
  Filled 2017-10-10 (×2): qty 2

## 2017-10-10 MED ORDER — ONDANSETRON HCL 4 MG PO TABS: 4.0000 mg | ORAL_TABLET | ORAL | Status: DC | PRN

## 2017-10-10 MED ORDER — BENZOCAINE-MENTHOL 20-0.5 % EX AERO
1.0000 "application " | INHALATION_SPRAY | CUTANEOUS | Status: DC | PRN
  Filled 2017-10-10: qty 56

## 2017-10-10 MED ORDER — ONDANSETRON HCL 4 MG/2ML IJ SOLN: 4.0000 mg | INTRAMUSCULAR | Status: DC | PRN

## 2017-10-10 MED ORDER — PRENATAL MULTIVITAMIN CH
1.0000 | ORAL_TABLET | Freq: Every day | ORAL | Status: DC
  Administered 2017-10-10 – 2017-10-11 (×2): 1 via ORAL
  Filled 2017-10-10 (×2): qty 1

## 2017-10-10 MED ORDER — COCONUT OIL OIL: 1.0000 "application " | TOPICAL_OIL | Status: DC | PRN

## 2017-10-10 MED ORDER — DIBUCAINE 1 % RE OINT: 1.0000 "application " | TOPICAL_OINTMENT | RECTAL | Status: DC | PRN

## 2017-10-10 MED ORDER — FERROUS SULFATE 325 (65 FE) MG PO TABS
325.0000 mg | ORAL_TABLET | Freq: Every day | ORAL | Status: DC
  Administered 2017-10-10 – 2017-10-11 (×2): 325 mg via ORAL
  Filled 2017-10-10 (×2): qty 1

## 2017-10-10 NOTE — Progress Notes (Signed)
Post Partum Day 1 s/p VBAC 15 hours postpartum Subjective: voiding, tolerating PO and having some cramping that is not being relieved by Motrin. Declines narcotic analgesic.  Bleeding decreasing. Had voided with out difficulty. Tolerating regular diet. Bottle feeding  Objective: Blood pressure 117/66, pulse 61, temperature 98.1 F (36.7 C), temperature source Oral, resp. rate 20, height 5\' 5"  (1.651 m), weight 106.6 kg (235 lb), last menstrual period 01/18/2017, SpO2 98 %, unknown if currently breastfeeding.  Physical Exam:  General: alert, cooperative and no distress Lochia: appropriate Uterine Fundus: firm/U+@/ML/NT Perineum: C+D+I DVT Evaluation: No evidence of DVT seen on physical exam.  Recent Labs    10/09/17 1227 10/10/17 0645  HGB 13.4 9.5*  HCT 39.2 28.0*  WBC 11.2* 12.3*  PLT 176 136*    Assessment/Plan: Stable PPD #1-continue postpartum care. Recheck fundus after urinating. Monitor bleeding Probable discharge tomorrow Mild anemia-iron and vitamin supplements Bottle feeding A negative. Baby O neg (weak D negative)/ -Rhogam not indicated/ RI/VI TDAP: 08/16/2017 Contraception: plans on hysterectomy/ NFP in the meantime  Jamie Weber, CNM     LOS: 1 day   Jamie Weber 10/10/2017, 10:13 AM

## 2017-10-10 NOTE — Anesthesia Postprocedure Evaluation (Signed)
Anesthesia Post Note  Patient: Jamie Weber  Procedure(s) Performed: AN AD HOC LABOR EPIDURAL  Patient location during evaluation: Mother Baby Anesthesia Type: Epidural Level of consciousness: awake, awake and alert and oriented Pain management: pain level controlled Vital Signs Assessment: post-procedure vital signs reviewed and stable Respiratory status: spontaneous breathing Cardiovascular status: blood pressure returned to baseline Postop Assessment: no headache, no backache and able to ambulate Anesthetic complications: no     Last Vitals:  Vitals:   10/10/17 0441 10/10/17 0750  BP: 109/62 117/66  Pulse: 82 61  Resp: 20 20  Temp: 36.8 C 36.7 C  SpO2: 99% 98%    Last Pain:  Vitals:   10/10/17 0750  TempSrc: Oral  PainSc:                  Karoline Caldwelleana Shirley Decamp

## 2017-10-11 NOTE — Plan of Care (Signed)
Discharge instructions reviewed with patient who verbalized understanding.  Follow up care reviewed, patient denies any questions or concerns at this time.

## 2017-10-11 NOTE — Progress Notes (Signed)
Patient discharged home with infant. Discharge instructions reviewed with patient who verbalized understanding.  Denies any questions or concerns at this time.

## 2017-10-12 ENCOUNTER — Telehealth: Payer: Self-pay

## 2017-10-12 NOTE — Telephone Encounter (Signed)
Please advise 

## 2017-10-12 NOTE — Telephone Encounter (Signed)
Pt delivered 10/09/17 & was d/c from hospital yesterday. She was advised to contact us if anything changed. Pt is experiencing golf ball sized blood clots this a.m. No heavy bleeding. RU#[phone]#[phone]

## 2017-10-16 ENCOUNTER — Encounter: Payer: Self-pay | Admitting: Obstetrics & Gynecology

## 2017-10-16 ENCOUNTER — Ambulatory Visit (INDEPENDENT_AMBULATORY_CARE_PROVIDER_SITE_OTHER): Payer: 59 | Admitting: Obstetrics & Gynecology

## 2017-10-16 VITALS — BP 140/80 | HR 81 | Ht 65.0 in | Wt 219.0 lb

## 2017-10-16 DIAGNOSIS — O165 Unspecified maternal hypertension, complicating the puerperium: Secondary | ICD-10-CM

## 2017-10-16 NOTE — Progress Notes (Signed)
Obstetrics & Gynecology Office Visit   Chief Complaint:  Chief Complaint  Patient presents with  . Follow-up    Swelling of legs     History of Present Illness: 32 y.o. [id number] being seen for follow up blood pressure check today.  The patient is PostPartum 7 days; no HTN w pregnancy or immediately PP; Prior PP Preeclampsia w last pregnancy.The established diagnosis for the patient is none.  She is currently on no antihypertensives.  She reports edema (comes and goes but worse since delivery) and headaches.  Medication list reviewed no medications contraindicated for use in patient with current hypertension were noted.  Past Medical History:  Past Medical History:  Diagnosis Date  . Allergy    history of hives controoled with zyrtec  . Depression     Past Surgical History:  Past Surgical History:  Procedure Laterality Date  . DILATION AND CURETTAGE OF UTERUS  Jun 2011   normal,. Kincius  . LAPAROSCOPY  September 03, 2009   hysteroscopy and D&C, Dr. Harold HedgeKincius    Gynecologic History: No LMP recorded.  Obstetric History: [id number]  Family History:  Family History  Problem Relation Age of Onset  . Hypothyroidism Father   . Cancer Maternal Grandmother        bilateral breast ca  . Hyperlipidemia Paternal Grandmother   . Heart disease Paternal Grandfather     Social History:  Social History   Socioeconomic History  . Marital status: Married    Spouse name: Not on file  . Number of children: Not on file  . Years of education: Not on file  . Highest education level: Not on file  Occupational History  . Not on file  Social Needs  . Financial resource strain: Not on file  . Food insecurity:    Worry: Not on file    Inability: Not on file  . Transportation needs:    Medical: Not on file    Non-medical: Not on file  Tobacco Use  . Smoking status: Former Smoker    Last attempt to quit: 10/03/2010    Years since quitting: 7.0  . Smokeless tobacco: Never Used  Substance  and Sexual Activity  . Alcohol use: Yes    Comment: occasional  . Drug use: No  . Sexual activity: Yes    Birth control/protection: Rhythm  Lifestyle  . Physical activity:    Days per week: Not on file    Minutes per session: Not on file  . Stress: Not on file  Relationships  . Social connections:    Talks on phone: Not on file    Gets together: Not on file    Attends religious service: Not on file    Active member of club or organization: Not on file    Attends meetings of clubs or organizations: Not on file    Relationship status: Not on file  . Intimate partner violence:    Fear of current or ex partner: Not on file    Emotionally abused: Not on file    Physically abused: Not on file    Forced sexual activity: Not on file  Other Topics Concern  . Not on file  Social History Narrative  . Not on file    Allergies:  No Known Allergies  Medications: Prior to Admission medications   Medication Sig Start Date End Date Taking? Authorizing Provider  Prenatal Vit-Fe Fumarate-FA (PRENATAL MULTIVITAMIN) TABS tablet Take 1 tablet by mouth daily at 12 noon.  [provider]    ROS  Physical Exam Blood pressure 140/80, pulse 81, height 5\' 5"  (1.651 m), weight 219 lb (99.3 kg), not currently breastfeeding.  No LMP recorded.  General: NAD HEENT: normocephalic, anicteric Pulmonary: No increased work of breathing Cardiovascular: RRR, distal pulses 2+ Extremities: Traceedema, no erythema, no tenderness Neurologic: Grossly intact Psychiatric: mood appropriate, affect full  Assessment: 32 y.o. [id number] presenting for blood pressure evaluation today  Plan: Problem List Items Addressed This Visit    None    Visit Diagnoses    Hypertension, postpartum condition or complication    -  Primary   Relevant Orders   Protein / creatinine ratio, urine   CBC   Comprehensive metabolic panel      1) Blood pressure - blood pressure at today's visit is borderline  elevated.  As a result will check for preeclampsia, based on her past history. - additional blood work CBC, CMP and P/C ratio  2) F/u 24 hours for BP check and lab review 3) Monitor for s/sx preeclampsia; treat as inpatient if occurs  Annamarie Major, MD, [facility], [facility] 10/16/2017  3:01 PM

## 2017-10-17 ENCOUNTER — Encounter: Payer: Medicaid Other | Admitting: Obstetrics & Gynecology

## 2017-10-17 ENCOUNTER — Ambulatory Visit (INDEPENDENT_AMBULATORY_CARE_PROVIDER_SITE_OTHER): Payer: Medicaid Other | Admitting: Maternal Newborn

## 2017-10-17 ENCOUNTER — Other Ambulatory Visit: Payer: Medicaid Other

## 2017-10-17 LAB — COMPREHENSIVE METABOLIC PANEL
ALK PHOS: 84 IU/L (ref 39–117)
ALT: 28 IU/L (ref 0–32)
AST: 17 IU/L (ref 0–40)
Albumin/Globulin Ratio: 1.5 (ref 1.2–2.2)
Albumin: 3.7 g/dL (ref 3.5–5.5)
BUN/Creatinine Ratio: 12 (ref 9–23)
BUN: 7 mg/dL (ref 6–20)
Bilirubin Total: <0.2 mg/dL (ref 0.0–1.2)
CO2: 23 mmol/L (ref 20–29)
CREATININE: 0.58 mg/dL (ref 0.57–1.00)
Calcium: 9.2 mg/dL (ref 8.7–10.2)
Chloride: 101 mmol/L (ref 96–106)
GFR calc Af Amer: 143 mL/min/{1.73_m2} (ref >59)
GFR calc non Af Amer: 124 mL/min/{1.73_m2} (ref >59)
GLUCOSE: 95 mg/dL (ref 65–99)
Globulin, Total: 2.5 g/dL (ref 1.5–4.5)
Potassium: 3.9 mmol/L (ref 3.5–5.2)
Sodium: 139 mmol/L (ref 134–144)
Total Protein: 6.2 g/dL (ref 6.0–8.5)

## 2017-10-17 LAB — CBC
HEMOGLOBIN: 10.2 g/dL — AB (ref 11.1–15.9)
Hematocrit: 30.9 % — ABNORMAL LOW (ref 34.0–46.6)
MCH: 31 pg (ref 26.6–33.0)
MCHC: 33 g/dL (ref 31.5–35.7)
MCV: 94 fL (ref 79–97)
Platelets: 398 10*3/uL (ref 150–450)
RBC: 3.29 x10E6/uL — ABNORMAL LOW (ref 3.77–5.28)
RDW: 13.4 % (ref 12.3–15.4)
WBC: 7.6 10*3/uL (ref 3.4–10.8)

## 2017-10-17 LAB — PROTEIN / CREATININE RATIO, URINE
Creatinine, Urine: 45.8 mg/dL
PROTEIN UR: 13 mg/dL
PROTEIN/CREAT RATIO: 284 mg/g{creat} — AB (ref 0–200)

## 2017-10-17 NOTE — Progress Notes (Signed)
Let her know urine test (as well as other labs) all normal and negative for preecalmpsia.

## 2017-10-17 NOTE — Progress Notes (Signed)
Postpartum Office Visit  S: Well-appearing, NAD, denies headache, visual changes, and pain.  O: Vitals:   10/17/17 0838  BP: 130/70  Pulse: 60   Reviewed lab results from last visit. CBC and CMP are within normal limits. Still waiting for results of PC ratio.  A: [id number] status post vaginal delivery, no signs or symptoms of pre-eclampsia today.  P: Return in one week for a blood pressure check. She will notify us if she develops any symptoms of pre-eclampsia.  Marcelyn BruinsJacelyn Pari Lombard, CNM

## 2017-10-17 NOTE — Progress Notes (Signed)
Pt aware.

## 2017-10-18 ENCOUNTER — Inpatient Hospital Stay: Admit: 2017-10-18 | Payer: Medicaid Other | Admitting: Obstetrics & Gynecology

## 2017-10-18 SURGERY — Surgical Case
Anesthesia: Choice

## 2017-10-19 ENCOUNTER — Encounter: Payer: Self-pay | Admitting: Maternal Newborn

## 2017-10-19 ENCOUNTER — Encounter

## 2017-10-19 ENCOUNTER — Ambulatory Visit: Payer: Medicaid Other | Admitting: Neurology

## 2017-10-24 ENCOUNTER — Encounter: Payer: Self-pay | Admitting: Obstetrics and Gynecology

## 2017-10-24 ENCOUNTER — Ambulatory Visit (INDEPENDENT_AMBULATORY_CARE_PROVIDER_SITE_OTHER): Payer: Medicaid Other | Admitting: Obstetrics and Gynecology

## 2017-10-24 VITALS — BP 134/80 | HR 79 | Ht 65.0 in | Wt 205.0 lb

## 2017-10-24 DIAGNOSIS — F53 Postpartum depression: Secondary | ICD-10-CM | POA: Diagnosis not present

## 2017-10-24 DIAGNOSIS — Z0130 Encounter for examination of blood pressure without abnormal findings: Secondary | ICD-10-CM

## 2017-10-24 DIAGNOSIS — O99345 Other mental disorders complicating the puerperium: Secondary | ICD-10-CM

## 2017-10-24 MED ORDER — ESCITALOPRAM OXALATE 10 MG PO TABS: 10.0000 mg | ORAL_TABLET | Freq: Every day | ORAL | 3 refills | Status: DC

## 2017-10-24 NOTE — Progress Notes (Signed)
Obstetrics & Gynecology Office Visit   Chief Complaint:  Chief Complaint  Patient presents with  . Postpartum Care    Vaginal delivery 7/9 B/P check    History of Present Illness: 32 y.o. [id number] being seen for follow up blood pressure check today.  The patient is recently postpartumThe established diagnosis for the patient is gestational hypertension.  She is currently on no antihypertensives.  She reports no current symptoms attributable to her blood pressure.  Medication list reviewed medications which may contribute to BP elevation were not noted.  The patient is a 32 y.o. female presenting initial evaluation for symptoms of anxiety.  The patient is currently taking nothing for the management of her symptoms.  She has had any recent situational stressors.  She reports symptoms of irritability, social anxiety and agorophobia.  She denies risk taking behavior, feelings of guilt, feelings of worthlessness, suicidal ideation, homicidal ideation, auditory hallucinations and visual hallucinations. Symptoms have worsened since last visit.     The patient does have a pre-existing history of depression and anxiety.  She  does not a prior history of suicide attempts.  Previous treatment tied include benzodiazepines.   Review of Systems: Review of Systems  Constitutional: Negative.   Neurological: Negative.    Past Medical History:  Past Medical History:  Diagnosis Date  . Allergy    history of hives controoled with zyrtec  . Depression     Past Surgical History:  Past Surgical History:  Procedure Laterality Date  . DILATION AND CURETTAGE OF UTERUS  Jun 2011   normal,. Kincius  . LAPAROSCOPY  September 03, 2009   hysteroscopy and D&C, Dr. Harold Hedge    Gynecologic History: No LMP recorded.  Obstetric History: [id number]  Family History:  Family History  Problem Relation Age of Onset  . Hypothyroidism Father   . Cancer Maternal Grandmother        bilateral breast ca  .  Hyperlipidemia Paternal Grandmother   . Heart disease Paternal Grandfather     Social History:  Social History   Socioeconomic History  . Marital status: Married    Spouse name: Not on file  . Number of children: Not on file  . Years of education: Not on file  . Highest education level: Not on file  Occupational History  . Not on file  Social Needs  . Financial resource strain: Not on file  . Food insecurity:    Worry: Not on file    Inability: Not on file  . Transportation needs:    Medical: Not on file    Non-medical: Not on file  Tobacco Use  . Smoking status: Former Smoker    Last attempt to quit: 10/03/2010    Years since quitting: 7.0  . Smokeless tobacco: Never Used  Substance and Sexual Activity  . Alcohol use: Yes    Comment: occasional  . Drug use: No  . Sexual activity: Yes    Birth control/protection: Rhythm  Lifestyle  . Physical activity:    Days per week: Not on file    Minutes per session: Not on file  . Stress: Not on file  Relationships  . Social connections:    Talks on phone: Not on file    Gets together: Not on file    Attends religious service: Not on file    Active member of club or organization: Not on file    Attends meetings of clubs or organizations: Not on file    Relationship  status: Not on file  . Intimate partner violence:    Fear of current or ex partner: Not on file    Emotionally abused: Not on file    Physically abused: Not on file    Forced sexual activity: Not on file  Other Topics Concern  . Not on file  Social History Narrative  . Not on file    Allergies:  No Known Allergies  Medications: Prior to Admission medications   Medication Sig Start Date End Date Taking? Authorizing Provider  Prenatal Vit-Fe Fumarate-FA (PRENATAL MULTIVITAMIN) TABS tablet Take 1 tablet by mouth daily at 12 noon.    [provider]    Physical Exam Blood pressure 134/80, pulse 79, height 5\' 5"  (1.651 m), weight 205 lb (93 kg),  not currently breastfeeding.  No LMP recorded.  General: NAD HEENT: normocephalic, anicteric Pulmonary: No increased work of breathing Neurologic: Grossly intact Psychiatric: mood appropriate, affect full  Assessment: 32 y.o. [id number] presenting for blood pressure evaluation today  Plan: Problem List Items Addressed This Visit    None    Visit Diagnoses    Postpartum depression    -  Primary   Relevant Medications   escitalopram (LEXAPRO) 10 MG tablet   BP check          1) Blood pressure - blood pressure at today's visit is normotensive.  As a result no antihypertensive indicated. - additional blood work was not obtained - Mirena postpartum contraception - want hysterectomy endometrial bx with mirena discussed would recommend sometime out from postpartum period before considering - start lexapro 10mg  discussed side-effect profile, re-evaluation in 4 week to determine need to titrate up - Return in about 1 month (around 11/21/2017) for 6 week postpartum.   Vena AustriaAndreas Therron Sells, MD, Evern CoreFACOG [facility], [facility] 10/24/2017, 9:18 PM

## 2017-11-21 ENCOUNTER — Other Ambulatory Visit (HOSPITAL_COMMUNITY)
Admission: RE | Admit: 2017-11-21 | Discharge: 2017-11-21 | Disposition: A | Discharge status: Home / Self Care | Payer: Medicaid Other | Source: Ambulatory Visit | Attending: Obstetrics and Gynecology | Admitting: Obstetrics and Gynecology

## 2017-11-21 ENCOUNTER — Encounter: Payer: Self-pay | Admitting: Obstetrics and Gynecology

## 2017-11-21 ENCOUNTER — Ambulatory Visit (INDEPENDENT_AMBULATORY_CARE_PROVIDER_SITE_OTHER): Payer: Medicaid Other | Admitting: Obstetrics and Gynecology

## 2017-11-21 VITALS — BP 128/72 | HR 73 | Wt 204.0 lb

## 2017-11-21 DIAGNOSIS — N939 Abnormal uterine and vaginal bleeding, unspecified: Secondary | ICD-10-CM | POA: Insufficient documentation

## 2017-11-21 DIAGNOSIS — R87610 Atypical squamous cells of undetermined significance on cytologic smear of cervix (ASC-US): Secondary | ICD-10-CM | POA: Diagnosis not present

## 2017-11-21 DIAGNOSIS — Z124 Encounter for screening for malignant neoplasm of cervix: Secondary | ICD-10-CM | POA: Insufficient documentation

## 2017-11-21 MED ORDER — NORELGESTROMIN-ETH ESTRADIOL 150-35 MCG/24HR TD PTWK: 1.0000 | MEDICATED_PATCH | TRANSDERMAL | 12 refills | Status: DC

## 2017-11-21 MED ORDER — ESCITALOPRAM OXALATE 10 MG PO TABS: 10.0000 mg | ORAL_TABLET | Freq: Every day | ORAL | 11 refills | Status: DC

## 2017-11-21 NOTE — Progress Notes (Signed)
Postpartum Visit  Chief Complaint:  Chief Complaint  Patient presents with  . Postpartum Care    Vaginal delivery 7/9    History of Present Illness: Patient is a 32 y.o. [id number] presents for postpartum visit.  Review the Delivery Report for details.  This patient has no babies on file. <<This visit is not associated with a pregnancy. Delivery information will not be displayed.>><<This visit is not associated with a pregnancy. Delivery information will not be displayed.>> Date of delivery: .ob Type of delivery: Vaginal delivery - Vacuum or forceps assisted  no Episiotomy No.  Laceration: no  Pregnancy or labor problems:  no Any problems since the delivery:  no  Newborn Details:  SINGLETON :  1. BabyGender female.  Maternal Details:  Breast or formula feeding: plans to breastfeed Any bowel or bladder issues: No  Post partum depression/anxiety noted:  no Edinburgh Post-Partum Depression Score:9 Date of last PAP: 11/04/2014  NIL and HR HPV negative   Review of Systems: Review of Systems  Constitutional: Negative.   Gastrointestinal: Negative.   Genitourinary: Negative.   Psychiatric/Behavioral: Negative.     The following portions of the patient's history were reviewed and updated as appropriate: allergies, current medications, past family history, past medical history, past social history, past surgical history and problem list.  Past Medical History:  Past Medical History:  Diagnosis Date  . Allergy    history of hives controoled with zyrtec  . Depression     Past Surgical History:  Past Surgical History:  Procedure Laterality Date  . DILATION AND CURETTAGE OF UTERUS  Jun 2011   normal,. Kincius  . LAPAROSCOPY  September 03, 2009   hysteroscopy and D&C, Dr. Harold Hedge    Family History:  Family History  Problem Relation Age of Onset  . Hypothyroidism Father   . Cancer Maternal Grandmother        bilateral breast ca  . Hyperlipidemia Paternal Grandmother   .  Heart disease Paternal Grandfather     Social History:  Social History   Socioeconomic History  . Marital status: Married    Spouse name: Not on file  . Number of children: Not on file  . Years of education: Not on file  . Highest education level: Not on file  Occupational History  . Not on file  Social Needs  . Financial resource strain: Not on file  . Food insecurity:    Worry: Not on file    Inability: Not on file  . Transportation needs:    Medical: Not on file    Non-medical: Not on file  Tobacco Use  . Smoking status: Former Smoker    Last attempt to quit: 10/03/2010    Years since quitting: 7.1  . Smokeless tobacco: Never Used  Substance and Sexual Activity  . Alcohol use: Yes    Comment: occasional  . Drug use: No  . Sexual activity: Yes    Birth control/protection: Rhythm  Lifestyle  . Physical activity:    Days per week: Not on file    Minutes per session: Not on file  . Stress: Not on file  Relationships  . Social connections:    Talks on phone: Not on file    Gets together: Not on file    Attends religious service: Not on file    Active member of club or organization: Not on file    Attends meetings of clubs or organizations: Not on file    Relationship status: Not  on file  . Intimate partner violence:    Fear of current or ex partner: Not on file    Emotionally abused: Not on file    Physically abused: Not on file    Forced sexual activity: Not on file  Other Topics Concern  . Not on file  Social History Narrative  . Not on file    Allergies:  No Known Allergies  Medications: Prior to Admission medications   Medication Sig Start Date End Date Taking? Authorizing Provider  escitalopram (LEXAPRO) 10 MG tablet Take 1 tablet (10 mg total) by mouth daily. 11/21/17   Vena AustriaStaebler, Khristian Phillippi, MD  norelgestromin-ethinyl estradiol Burr Medico(XULANE) 150-35 MCG/24HR transdermal patch Place 1 patch onto the skin once a week. 11/21/17   Vena AustriaStaebler, Cosima Prentiss, MD  Prenatal  Vit-Fe Fumarate-FA (PRENATAL MULTIVITAMIN) TABS tablet Take 1 tablet by mouth daily at 12 noon.    [provider]    Physical Exam Blood pressure 128/72, pulse 73, weight 204 lb (92.5 kg), not currently breastfeeding.    General: NAD HEENT: normocephalic, anicteric Pulmonary: No increased work of breathing Abdomen: NABS, soft, non-tender, non-distended.  Umbilicus without lesions.  No hepatomegaly, splenomegaly or masses palpable. No evidence of hernia. Genitourinary:  External: Normal external female genitalia.  Normal urethral meatus, normal  Bartholin's and Skene's glands.    Vagina: Normal vaginal mucosa, no evidence of prolapse.    Cervix: Grossly normal in appearance, no bleeding  Uterus: Non-enlarged, mobile, normal contour.  No CMT  Adnexa: ovaries non-enlarged, no adnexal masses  Rectal: deferred Extremities: no edema, erythema, or tenderness Neurologic: Grossly intact Psychiatric: mood appropriate, affect full  Edinburgh Postnatal Depression Scale - 11/21/17 1632      Edinburgh Postnatal Depression Scale:  In the Past 7 Days   I have been able to laugh and see the funny side of things.  0    I have looked forward with enjoyment to things.  0    I have blamed myself unnecessarily when things went wrong.  2    I have been anxious or worried for no good reason.  3    I have felt scared or panicky for no good reason.  1    Things have been getting on top of me.  1    I have been so unhappy that I have had difficulty sleeping.  1    I have felt sad or miserable.  1    I have been so unhappy that I have been crying.  1    The thought of harming myself has occurred to me.  0    Edinburgh Postnatal Depression Scale Total  10        ENDOMETRIAL BIOPSY     The indications for endometrial biopsy were reviewed.   Risks of the biopsy including cramping, bleeding, infection, uterine perforation, inadequate specimen and need for additional procedures  were discussed. The  patient states she understands and agrees to undergo procedure today. Consent was signed. Time out was performed. Urine HCG was negative. A Graves speculum was placed and the cervix was brought into view.  The cervix was prepped with Betadine. A single-toothed tenaculum was  placed on the anterior lip of the cervix for traction. A 3 mm pipelle was introduced through the cervix into the endometrial cavity without difficulty to a depth of 8cm, and a moderate amount of tissue was obtained, the resulting specime sent to pathology, actively bleeding so two passes collected. The instruments were removed from the patient's  vagina. Minimal bleeding from the cervix was noted. The patient tolerated the procedure well. Routine post-procedure instructions were given to the patient.  She will be contacted by phone one results become available.     Assessment: 32 y.o. [id number] presenting for 6 week postpartum visit, endometrial biopsy for AUB  Plan: Problem List Items Addressed This Visit    None    Visit Diagnoses    Abnormal uterine bleeding    -  Primary   Relevant Orders   Surgical pathology (Completed)   6 weeks postpartum follow-up       Screening for malignant neoplasm of cervix       Relevant Orders   Cytology - PAP       1) Contraception - Education given regarding options for contraception, as well as compatibility with breast feeding if applicable.  Patient plans on Ortho-Evra patches weekly for contraception.  2)  Pap - ASCCP guidelines and rational discussed.  ASCCP guidelines and rational discussed.  Patient opts for every 3 years screening interval  3) Patient underwent screening for postpartum depression with no signs of depression  4) Patient with long standing history of menorrhagia.  Desires to proceed with hysterectomy.  We discussed alternative treatment options including hormonal/IUD.  Patient states no prior interventions have been successful and she is done child bearing.     5) Return if symptoms worsen or fail to improve.   Vena AustriaAndreas Taje Littler, MD, Merlinda FrederickFACOG [facility], [facility]

## 2017-11-27 LAB — CYTOLOGY - PAP
Diagnosis: UNDETERMINED — AB
HPV: NOT DETECTED

## 2017-12-04 ENCOUNTER — Telehealth: Payer: Self-pay | Admitting: Obstetrics and Gynecology

## 2017-12-04 NOTE — Telephone Encounter (Signed)
I will not take out ovaries on a health 32 year old, if she wants that I'm happy to set up a second opinion.  It has negative effects on overall life expectancy

## 2017-12-04 NOTE — Telephone Encounter (Signed)
Contacted the patient to schedule, but she wants ovaries out as well, said her mother and mother -in-law both had partial hysterectomies and had to go back later for ovaries to be removed, doesn't know why.Patient is also concerned about ovarian cancer and late detection.

## 2017-12-04 NOTE — Telephone Encounter (Signed)
-----   Message from Vena Austria, MD sent at 11/27/2017  2:16 PM EDT ----- Regarding: Surgery Surgery Date:   LOS: observation  Surgery Booking Request Patient Full Name: Jamie Weber MRN: [id number]  DOB: 1985-12-06  Surgeon: Vena Austria, MD  Requested Surgery Date and Time: 4-6 weeks Primary Diagnosis and Code: Abnormal uterine bleeding Secondary Diagnosis and Code:  Surgical Procedure: TLH/BS and cystoscopy L&D Notification:N/A Admission Status: same day surgery Length of Surgery: 2hrs Special Case Needs: none H&P: week prior (date) Phone Interview or Office Pre-Admit: pre-admit Interpreter: No Language: English Medical Clearance: No Special Scheduling Instructions: none

## 2017-12-07 NOTE — Telephone Encounter (Signed)
Lmtrc

## 2017-12-10 NOTE — Telephone Encounter (Signed)
Lmtrc

## 2017-12-11 NOTE — Telephone Encounter (Signed)
Lmtrc

## 2017-12-12 NOTE — Telephone Encounter (Signed)
Lmtrc

## 2017-12-12 NOTE — Telephone Encounter (Signed)
Patient stopped by the office to schedule surgery. Patient agreed to leave the ovaries, and declined a second opinion. Patient is aware of H&P at [facility] on 12/27/17 @ 8:50am, Pre-admit Testing afterwards, and OR on 01/03/18. Patient is aware she may receive calls from the [facility] and [facility]. Patient confirmed Medicaid, and no secondary insurance.

## 2017-12-27 ENCOUNTER — Encounter
Admission: RE | Admit: 2017-12-27 | Discharge: 2017-12-27 | Disposition: A | Discharge status: Home / Self Care | Payer: Medicaid Other | Source: Ambulatory Visit | Attending: Obstetrics and Gynecology | Admitting: Obstetrics and Gynecology

## 2017-12-27 ENCOUNTER — Encounter: Payer: Self-pay | Admitting: Obstetrics and Gynecology

## 2017-12-27 ENCOUNTER — Other Ambulatory Visit: Payer: Self-pay

## 2017-12-27 ENCOUNTER — Ambulatory Visit (INDEPENDENT_AMBULATORY_CARE_PROVIDER_SITE_OTHER): Payer: Medicaid Other | Admitting: Obstetrics and Gynecology

## 2017-12-27 VITALS — BP 102/68 | HR 77 | Ht 65.0 in | Wt 213.0 lb

## 2017-12-27 DIAGNOSIS — N939 Abnormal uterine and vaginal bleeding, unspecified: Secondary | ICD-10-CM | POA: Insufficient documentation

## 2017-12-27 DIAGNOSIS — Z01818 Encounter for other preprocedural examination: Secondary | ICD-10-CM | POA: Diagnosis present

## 2017-12-27 HISTORY — DX: Anxiety disorder, unspecified: F41.9

## 2017-12-27 HISTORY — DX: Migraine, unspecified, not intractable, without status migrainosus: G43.909

## 2017-12-27 HISTORY — DX: Gastro-esophageal reflux disease without esophagitis: K21.9

## 2017-12-27 LAB — CBC
HEMATOCRIT: 36.7 % (ref 35.0–47.0)
HEMOGLOBIN: 12.1 g/dL (ref 12.0–16.0)
MCH: 28.4 pg (ref 26.0–34.0)
MCHC: 33.1 g/dL (ref 32.0–36.0)
MCV: 85.8 fL (ref 80.0–100.0)
Platelets: 355 10*3/uL (ref 150–440)
RBC: 4.28 MIL/uL (ref 3.80–5.20)
RDW: 14.4 % (ref 11.5–14.5)
WBC: 5.1 10*3/uL (ref 3.6–11.0)

## 2017-12-27 LAB — TYPE AND SCREEN
ABO/RH(D): A NEG
Antibody Screen: NEGATIVE

## 2017-12-27 LAB — BASIC METABOLIC PANEL
ANION GAP: 6 (ref 5–15)
BUN: 14 mg/dL (ref 6–20)
CALCIUM: 8.9 mg/dL (ref 8.9–10.3)
CO2: 28 mmol/L (ref 22–32)
Chloride: 103 mmol/L (ref 98–111)
Creatinine, Ser: 0.55 mg/dL (ref 0.44–1.00)
GFR calc Af Amer: >60 mL/min (ref >60)
GFR calc non Af Amer: >60 mL/min (ref >60)
Glucose, Bld: 101 mg/dL — ABNORMAL HIGH (ref 70–99)
Potassium: 4.1 mmol/L (ref 3.5–5.1)
Sodium: 137 mmol/L (ref 135–145)

## 2017-12-27 NOTE — Patient Instructions (Addendum)
Your procedure is scheduled on: 01/03/18 Thur Report to Same Day Surgery 2nd floor medical mall [facility] Entrance-take elevator on left to 2nd floor.  Check in with surgery information desk.) To find out your arrival time please call [phone] between 1PM - 3PM on 01/02/18 Wed Remember: Instructions that are not followed completely may result in serious medical risk, up to and including death, or upon the discretion of your surgeon and anesthesiologist your surgery may need to be rescheduled.    _x___ 1. Do not eat food after midnight the night before your procedure. You may drink clear liquids up to 2 hours before you are scheduled to arrive at the hospital for your procedure.  Do not drink clear liquids within 2 hours of your scheduled arrival to the hospital.  Clear liquids include  --Water or Apple juice without pulp  --Clear carbohydrate beverage such as ClearFast or Gatorade  --Black Coffee or Clear Tea (No milk, no creamers, do not add anything to                  the coffee or Tea Type 1 and type 2 diabetics should only drink water.   ____Ensure clear carbohydrate drink on the way to the hospital for bariatric patients  ____Ensure clear carbohydrate drink 3 hours before surgery for Dr Rutherford Nail patients if physician instructed.   No gum chewing or hard candies.     __x__ 2. No Alcohol for 24 hours before or after surgery.   __x__3. No Smoking or e-cigarettes for 24 prior to surgery.  Do not use any chewable tobacco products for at least 6 hour prior to surgery   ____  4. Bring all medications with you on the day of surgery if instructed.    __x__ 5. Notify your doctor if there is any change in your medical condition     (cold, fever, infections).    x___6. On the morning of surgery brush your teeth with toothpaste and water.  You may rinse your mouth with mouth wash if you wish.  Do not swallow any toothpaste or mouthwash.   Do not wear jewelry, make-up, hairpins,  clips or nail polish.  Do not wear lotions, powders, or perfumes. You may wear deodorant.  Do not shave 48 hours prior to surgery. Men may shave face and neck.  Do not bring valuables to the hospital.    [facility] is not responsible for any belongings or valuables.               Contacts, dentures or bridgework may not be worn into surgery.  Leave your suitcase in the car. After surgery it may be brought to your room.  For patients admitted to the hospital, discharge time is determined by your                       treatment team.  _  Patients discharged the day of surgery will not be allowed to drive home.  You will need someone to drive you home and stay with you the night of your procedure.    Please read over the following fact sheets that you were given:   [facility] Preparing for Surgery and or MRSA Information   _x___ Take anti-hypertensive listed below, cardiac, seizure, asthma,     anti-reflux and psychiatric medicines. These include:  1. escitalopram (LEXAPRO) 10 MG tablet the night before  2.  3.  4.  5.  6.  ____Fleets  enema or Magnesium Citrate as directed.   _x___ Use CHG Soap or sage wipes as directed on instruction sheet   ____ Use inhalers on the day of surgery and bring to hospital day of surgery  ____ Stop Metformin and Janumet 2 days prior to surgery.    ____ Take 1/2 of usual insulin dose the night before surgery and none on the morning     surgery.   _x___ Follow recommendations from Cardiologist, Pulmonologist or PCP regarding          stopping Aspirin, Coumadin, Plavix ,Eliquis, Effient, or Pradaxa, and Pletal.  X____Stop Anti-inflammatories such as Advil, Aleve, Ibuprofen, Motrin, Naproxen, Naprosyn, Goodies powders or aspirin products. OK to take Tylenol and                          Celebrex.   _x___ Stop supplements until after surgery.  But may continue Vitamin D, Vitamin B,       and multivitamin.   ____ Bring C-Pap to the hospital.

## 2017-12-27 NOTE — Progress Notes (Signed)
Obstetrics & Gynecology Surgery H&P    Chief Complaint: Scheduled Surgery   History of Present Illness: Patient is a 32 y.o. [id number] presenting for scheduled TLH, BS, cystosocpy, for the treatment or further evaluation of menorrhagia.   Prior Treatments prior to proceeding with surgery include: medical management   Review of Systems:10 point review of systems  Past Medical History:  Past Medical History:  Diagnosis Date  . Allergy    history of hives controoled with zyrtec  . Depression     Past Surgical History:  Past Surgical History:  Procedure Laterality Date  . DILATION AND CURETTAGE OF UTERUS  Jun 2011   normal,. K[place]ius  . LAPAROSCOPY  September 03, 2009   hysteroscopy and D&C, Dr. Harold Hedge    Family History:  Family History  Problem Relation Age of Onset  . Hypothyroidism Father   . Cancer Maternal Grandmother        bilateral breast ca  . Hyperlipidemia Paternal Grandmother   . Heart disease Paternal Grandfather     Social History:  Social History   Socioeconomic History  . Marital status: Married    Spouse name: Not on file  . Number of children: Not on file  . Years of education: Not on file  . Highest education level: Not on file  Occupational History  . Not on file  Social Needs  . Financial resource strain: Not on file  . Food insecurity:    Worry: Not on file    Inability: Not on file  . Transportation needs:    Medical: Not on file    Non-medical: Not on file  Tobacco Use  . Smoking status: Former Smoker    Last attempt to quit: 10/03/2010    Years since quitting: 7.2  . Smokeless tobacco: Never Used  Substance and Sexual Activity  . Alcohol use: Yes    Comment: occasional  . Drug use: No  . Sexual activity: Yes    Birth control/protection: Rhythm  Lifestyle  . Physical activity:    Days per week: Not on file    Minutes per session: Not on file  . Stress: Not on file  Relationships  . Social connections:    Talks on phone: Not on  file    Gets together: Not on file    Attends religious service: Not on file    Active member of club or organization: Not on file    Attends meetings of clubs or organizations: Not on file    Relationship status: Not on file  . Intimate partner violence:    Fear of current or ex partner: Not on file    Emotionally abused: Not on file    Physically abused: Not on file    Forced sexual activity: Not on file  Other Topics Concern  . Not on file  Social History Narrative  . Not on file    Allergies:  No Known Allergies  Medications: Prior to Admission medications   Medication Sig Start Date End Date Taking? Authorizing Provider  escitalopram (LEXAPRO) 10 MG tablet Take 1 tablet (10 mg total) by mouth daily. 11/21/17  Yes Vena Austria, MD  norelgestromin-ethinyl estradiol [facility]) 150-35 MCG/24HR transdermal patch Place 1 patch onto the skin once a week. Patient not taking: Reported on 12/27/2017 11/21/17   Vena Austria, MD  Prenatal Vit-Fe Fumarate-FA (PRENATAL MULTIVITAMIN) TABS tablet Take 1 tablet by mouth daily at 12 noon.    [provider]    Physical Exam Vitals:  Blood pressure 102/68, pulse 77, height 5\' 5"  (1.651 m), weight 213 lb (96.6 kg), last menstrual period 12/17/2017, not currently breastfeeding. General: NAD HEENT: normocephalic, anicteric Pulmonary: CTAB, No increased work of breathing Cardiovascular: RRR, distal pulses 2+ Abdomen: soft, non-tender, non-distended Genitourinary: deferred Extremities: no edema, erythema, or tenderness Neurologic: Grossly intact Psychiatric: mood appropriate, affect full  Imaging No results found.  Assessment: 32 y.o. Z6X0960 presenting for scheduled TLH, BS, cystosocpy  Plan: 1) Patient opts for definitive surgical management via hysterectomy. The risks of surgery were discussed in detail with the patient including but not limited to: bleeding which may require transfusion or reoperation; infection which may  require antibiotics; injury to bowel, bladder, ureters or other surrounding organs (With a literature reported rate of urinary tract injury of 1% quoted); need for additional procedures including laparotomy; thromboembolic phenomenon, incisional problems and other postoperative/anesthesia complications.  Patient was also advised that recovery procedure generally involves an overnight stay; and the  expected recovery time after a hysterectomy being in the range of 6-8 weeks.  Likelihood of success in alleviating the patient's symptoms was discussed.  While definitive in regards to issues with menstural bleeding, pelvic pain if present preoperatively may continue and in fact worsen postoperatively.  She is aware that the procedure will render her unable to pursue childbearing in the future.   She was told that she will be contacted by our surgical scheduler regarding the time and date of her surgery; routine preoperative instructions of having nothing to eat or drink after midnight on the day prior to surgery and also coming to the hospital 1.5 hours prior to her time of surgery were also emphasized.  She was told she may be called for a preoperative appointment about a week prior to surgery and will be given further preoperative instructions at that visit.  Routine postoperative instructions will be reviewed with the patient and her family in detail after surgery. Printed patient education handouts about the procedure was given to the patient to review at home.   2) Routine postoperative instructions were reviewed with the patient and her family in detail today including the expected length of recovery and likely postoperative course.  The patient concurred with the proposed plan, giving informed written consent for the surgery today.  Patient instructed on the importance of being NPO after midnight prior to her procedure.  If warranted preoperative prophylactic antibiotics and SCDs ordered on call to the OR to  meet SCIP guidelines and adhere to recommendation laid forth in ACOG Practice Bulletin Number 104 May 2009  "Antibiotic Prophylaxis for Gynecologic Procedures".     Vena Austria, MD, [facility], [facility] 12/27/2017, 9:55 AM

## 2018-01-01 ENCOUNTER — Telehealth: Payer: Self-pay | Admitting: Obstetrics and Gynecology

## 2018-01-01 NOTE — Telephone Encounter (Signed)
I spoke to the patient to let her know her Medicaid changed to FPW and will not cover the surgery. Patient is aware is she has the surgery she will be self pay. Patient agreed to contact Medicaid now to see what the problem is, and whether it can be corrected by 01/03/18. Patient is aware we can reschedule the surgery to a later date if needed.

## 2018-01-01 NOTE — Telephone Encounter (Signed)
Patient's Medicaid changed to FPW eff 01/01/18, and will not cover her surgery. Lmtrc.

## 2018-01-03 ENCOUNTER — Encounter: Payer: Self-pay | Admitting: Obstetrics and Gynecology

## 2018-01-03 ENCOUNTER — Ambulatory Visit
Admission: RE | Admit: 2018-01-03 | Payer: Medicaid Other | Source: Ambulatory Visit | Admitting: Obstetrics and Gynecology

## 2018-01-03 ENCOUNTER — Encounter: Admission: RE | Payer: Self-pay | Source: Ambulatory Visit

## 2018-01-03 ENCOUNTER — Ambulatory Visit (INDEPENDENT_AMBULATORY_CARE_PROVIDER_SITE_OTHER): Payer: Medicaid Other | Admitting: Obstetrics and Gynecology

## 2018-01-03 VITALS — BP 122/80 | HR 82 | Wt 213.0 lb

## 2018-01-03 DIAGNOSIS — Z3049 Encounter for surveillance of other contraceptives: Secondary | ICD-10-CM

## 2018-01-03 DIAGNOSIS — Z30017 Encounter for initial prescription of implantable subdermal contraceptive: Secondary | ICD-10-CM

## 2018-01-03 SURGERY — HYSTERECTOMY, TOTAL, LAPAROSCOPIC
Anesthesia: Choice

## 2018-01-06 NOTE — Progress Notes (Signed)
    GYNECOLOGY PROCEDURE NOTE  Patient is a 32 y.o. [id number] presenting for Nexplanon insertion as her desires means of contraception.  She provided informed consent, signed copy in the chart, time out was performed. Pregnancy test was negative, with self reported LMP of Patient's last menstrual period was 12/17/2017 (approximate).  She understands that Nexplanon is a progesterone only therapy, and that patients often patients have irregular and unpredictable vaginal bleeding or amenorrhea. She understands that other side effects are possible related to systemic progesterone, including but not limited to, headaches, breast tenderness, nausea, and irritability. While effective at preventing pregnancy long acting reversible contraceptives do not prevent transmission of sexually transmitted diseases and use of barrier methods for this purpose was discussed. The placement procedure for Nexplanon was reviewed with the patient in detail including risks of nerve injury, infection, bleeding and injury to other muscles or tendons. She understands that the Nexplanon implant is good for 3 years and needs to be removed at the end of that time.  She understands that Nexplanon is an extremely effective option for contraception, with failure rate of <1%. This information is reviewed today and all questions were answered. Informed consent was obtained, both verbally and written.   The patient is healthy and has no contraindications to Implanon use. Urine pregnancy test was performed today and was negative.  Procedure Appropriate time out taken.  Patient placed in dorsal supine with left arm above head, elbow flexed at 90 degrees, arm resting on examination table.  The bicipital grove was palpated and site 8-10cm proximal to the medial epicondyle was indentified . The insertion site was prepped with a two betadine swabs and then injected with 3 cc of 1% lidocaine without epinephrine.  Nexplanon removed form sterile blister  packaging,  Device confirmed in needle, before inserting full length of needle, tenting up the skin as the needle was advance.  The drug eluting rod was then deployed by pulling back the slider per the manufactures recommendation.  The implant was palpable by the clinician as well as the patient.  The insertion site covered dressed with a band aid before applying  a kerlex bandage pressure dressing..Minimal blood loss was noted during the procedure.  The patientt tolerated the procedure well.   She was instructed to wear the bandage for 24 hours, call with any signs of infection.  She was given the Implanon card and instructed to have the rod removed in 3 years.  Charge [phone] for nexplanon device, CPT [id number] for procedure J2001 for lidocaine administration Modifer 25, plus Modifer 79 is done during a global billing visit

## 2018-01-10 ENCOUNTER — Ambulatory Visit: Payer: Medicaid Other | Admitting: Obstetrics and Gynecology

## 2018-03-04 ENCOUNTER — Other Ambulatory Visit: Payer: Self-pay | Admitting: Obstetrics and Gynecology

## 2018-03-04 ENCOUNTER — Telehealth: Payer: Self-pay

## 2018-03-04 MED ORDER — ESCITALOPRAM OXALATE 10 MG PO TABS: 10.0000 mg | ORAL_TABLET | Freq: Every day | ORAL | 11 refills | Status: DC

## 2018-03-04 NOTE — Telephone Encounter (Signed)
Pt aware.

## 2018-03-04 NOTE — Telephone Encounter (Signed)
Pt requesting rf of Lexapro.She will be out in a few days Cb#[phone] or cell #[phone]

## 2018-03-04 NOTE — Telephone Encounter (Signed)
sent 

## 2018-03-14 ENCOUNTER — Telehealth: Payer: Self-pay | Admitting: Obstetrics and Gynecology

## 2018-03-14 NOTE — Telephone Encounter (Signed)
Patient called to reschedule her surgery. Patient is aware of [facility] on 04/08/18 @ 10:50am w/ Dr Gary FleetStaeboer, Pre-admit Testing afterwards, and OR on 04/16/18. Patient is aware she may receive calls from the [facility] and [facility]. Per patient, she applied for coverage today through healthcare.gov and her documentation indicates she applied for a [facility] product. Patient is aware [place] and [facility] will be out-of-network. Per patient, the agent checked her system and said we were in-network. Patient understands the information we received from [facility] indicates we are our-of-network, but she can re-enroll in [employer] [place] through healthcare.gov before December 15. Patient will contact the agent about switching to [employer], and will call me w/ the subscriber ID when approved. Ext given.

## 2018-04-01 NOTE — Telephone Encounter (Signed)
Patient called w/ [employer] policy# [id number]. Patient decided to reschedule surgery for February. Patient is aware of [facility] on 05/21/18 @ 9:10am, Pre-admit Testing afterwards, and OR on 05/28/18.

## 2018-04-08 ENCOUNTER — Other Ambulatory Visit: Payer: Medicaid Other

## 2018-04-08 ENCOUNTER — Encounter: Payer: Self-pay | Admitting: Obstetrics and Gynecology

## 2018-05-16 ENCOUNTER — Other Ambulatory Visit: Payer: Medicaid Other

## 2018-05-21 ENCOUNTER — Encounter: Payer: Self-pay | Admitting: Obstetrics and Gynecology

## 2018-05-21 ENCOUNTER — Ambulatory Visit (INDEPENDENT_AMBULATORY_CARE_PROVIDER_SITE_OTHER): Payer: PRIVATE HEALTH INSURANCE | Admitting: Obstetrics and Gynecology

## 2018-05-21 ENCOUNTER — Encounter
Admission: RE | Admit: 2018-05-21 | Discharge: 2018-05-21 | Disposition: A | Discharge status: Home / Self Care | Payer: PRIVATE HEALTH INSURANCE | Source: Ambulatory Visit | Attending: Obstetrics and Gynecology | Admitting: Obstetrics and Gynecology

## 2018-05-21 ENCOUNTER — Other Ambulatory Visit: Payer: Self-pay

## 2018-05-21 VITALS — BP 124/84 | HR 76 | Ht 65.0 in | Wt 227.0 lb

## 2018-05-21 DIAGNOSIS — Z01818 Encounter for other preprocedural examination: Secondary | ICD-10-CM | POA: Diagnosis not present

## 2018-05-21 DIAGNOSIS — N920 Excessive and frequent menstruation with regular cycle: Secondary | ICD-10-CM

## 2018-05-21 DIAGNOSIS — N946 Dysmenorrhea, unspecified: Secondary | ICD-10-CM | POA: Diagnosis not present

## 2018-05-21 DIAGNOSIS — Z01812 Encounter for preprocedural laboratory examination: Secondary | ICD-10-CM | POA: Diagnosis present

## 2018-05-21 HISTORY — DX: Nausea with vomiting, unspecified: R11.2

## 2018-05-21 HISTORY — DX: Other specified postprocedural states: Z98.890

## 2018-05-21 LAB — BASIC METABOLIC PANEL
Anion gap: 7 (ref 5–15)
BUN: 8 mg/dL (ref 6–20)
CHLORIDE: 108 mmol/L (ref 98–111)
CO2: 25 mmol/L (ref 22–32)
Calcium: 9.2 mg/dL (ref 8.9–10.3)
Creatinine, Ser: 0.62 mg/dL (ref 0.44–1.00)
GFR calc Af Amer: >60 mL/min (ref >60)
GFR calc non Af Amer: >60 mL/min (ref >60)
GLUCOSE: 110 mg/dL — AB (ref 70–99)
Potassium: 4 mmol/L (ref 3.5–5.1)
Sodium: 140 mmol/L (ref 135–145)

## 2018-05-21 LAB — TYPE AND SCREEN
ABO/RH(D): A NEG
Antibody Screen: NEGATIVE

## 2018-05-21 LAB — CBC
HEMATOCRIT: 39.3 % (ref 36.0–46.0)
HEMOGLOBIN: 12.4 g/dL (ref 12.0–15.0)
MCH: 26.8 pg (ref 26.0–34.0)
MCHC: 31.6 g/dL (ref 30.0–36.0)
MCV: 84.9 fL (ref 80.0–100.0)
Platelets: 363 10*3/uL (ref 150–400)
RBC: 4.63 MIL/uL (ref 3.87–5.11)
RDW: 13.8 % (ref 11.5–15.5)
WBC: 6.3 10*3/uL (ref 4.0–10.5)
nRBC: 0 % (ref 0.0–0.2)

## 2018-05-21 NOTE — Patient Instructions (Signed)
Your procedure is scheduled on: Tuesday, May 28, 2018 Report to Day Surgery on the 2nd floor of the [facility] To find out your arrival time, please call [phone] between 1PM - 3PM on: Monday, Feb. 24  REMEMBER: Instructions that are not followed completely may result in serious medical risk, up to and including death; or upon the discretion of your surgeon and anesthesiologist your surgery may need to be rescheduled.  Do not eat food after midnight the night before surgery.  No gum chewing, lozengers or hard candies.  You may however, drink CLEAR liquids up to 2 hours before you are scheduled to arrive for your surgery. Do not drink anything within 2 hours of the start of your surgery.  Clear liquids include: - water  - apple juice without pulp - gatorade - black coffee or tea (Do NOT add milk or creamers to the coffee or tea) Do NOT drink anything that is not on this list.  ENSURE PRE-SURGERY CARBOHYDRATE DRINK:  Complete drinking 3 hours prior to leaving for the hospital.  No Alcohol for 24 hours before or after surgery.  No Smoking including e-cigarettes for 24 hours prior to surgery.  No chewable tobacco products for at least 6 hours prior to surgery.  No nicotine patches on the day of surgery.  On the morning of surgery brush your teeth with toothpaste and water, you may rinse your mouth with mouthwash if you wish. Do not swallow any toothpaste or mouthwash.  Notify your doctor if there is any change in your medical condition (cold, fever, infection).  Do not wear jewelry, make-up, hairpins, clips or nail polish.  Do not wear lotions, powders, or perfumes.   Do not shave 48 hours prior to surgery.   Contacts and dentures may not be worn into surgery.  Do not bring valuables to the hospital, including drivers license, insurance or credit cards.  [facility] is not responsible for any belongings or valuables.   DO NOT TAKE ANY MEDICATIONS THE MORNING OF  SURGERY  Use CHG Soap as directed on instruction sheet.  Now!  Stop Anti-inflammatories (NSAIDS) such as Advil, Aleve, Ibuprofen, Motrin, Naproxen, Naprosyn and Aspirin based products such as Excedrin, Goodys Powder, BC Powder. (May take Tylenol or Acetaminophen if needed.)  Now!  Stop ANY OVER THE COUNTER supplements until after surgery.  Wear comfortable clothing (specific to your surgery type) to the hospital.  Plan for stool softeners for home use.  If you are being admitted to the hospital overnight, leave your suitcase in the car. After surgery it may be brought to your room.  If you are being discharged the day of surgery, you will not be allowed to drive home. You will need a responsible adult to drive you home and stay with you that night.   If you are taking public transportation, you will need to have a responsible adult with you. Please confirm with your physician that it is acceptable to use public transportation.   Please call [phone] if you have any questions about these instructions.

## 2018-05-21 NOTE — Progress Notes (Signed)
Obstetrics & Gynecology Surgery H&P    Chief Complaint: Scheduled Surgery   History of Present Illness: Patient is a 33 y.o. [id number] presenting for scheduled TLH, BS, cystoscopy, for the treatment or further evaluation of dysmenorrhea.   Prior Treatments prior to proceeding with surgery include: Nexplanon  Preoperative Pap:11/21/2017 Results:ASCUS HPV negative Preoperative Endometrial biopsy: N/A Preoperative Ultrasound: Normal uterus on pregnancy ultrasound last year   Review of Systems:10 point review of systems  Past Medical History:  Past Medical History:  Diagnosis Date  . Allergy    history of hives controoled with zyrtec  . Anxiety   . Depression   . GERD (gastroesophageal reflux disease)   . Migraines     Past Surgical History:  Past Surgical History:  Procedure Laterality Date  . CESAREAN SECTION    . DILATION AND CURETTAGE OF UTERUS  Jun 2011   normal,. [facility]  . FOOT TENDON SURGERY    . LAPAROSCOPY  September 03, 2009   hysteroscopy and D&C, Dr. Harold Hedge    Family History:  Family History  Problem Relation Age of Onset  . Hypothyroidism Father   . Cancer Maternal Grandmother        bilateral breast ca  . Hyperlipidemia Paternal Grandmother   . Heart disease Paternal Grandfather     Social History:  Social History   Socioeconomic History  . Marital status: Married    Spouse name: Not on file  . Number of children: Not on file  . Years of education: Not on file  . Highest education level: Not on file  Occupational History  . Not on file  Social Needs  . Financial resource strain: Not on file  . Food insecurity:    Worry: Not on file    Inability: Not on file  . Transportation needs:    Medical: Not on file    Non-medical: Not on file  Tobacco Use  . Smoking status: Former Smoker    Last attempt to quit: 10/03/2010    Years since quitting: 7.6  . Smokeless tobacco: Never Used  Substance and Sexual Activity  . Alcohol use: Yes    Comment:  occasional  . Drug use: No  . Sexual activity: Yes    Birth control/protection: Rhythm  Lifestyle  . Physical activity:    Days per week: Not on file    Minutes per session: Not on file  . Stress: Not on file  Relationships  . Social connections:    Talks on phone: Not on file    Gets together: Not on file    Attends religious service: Not on file    Active member of club or organization: Not on file    Attends meetings of clubs or organizations: Not on file    Relationship status: Not on file  . Intimate partner violence:    Fear of current or ex partner: Not on file    Emotionally abused: Not on file    Physically abused: Not on file    Forced sexual activity: Not on file  Other Topics Concern  . Not on file  Social History Narrative  . Not on file    Allergies:  No Known Allergies  Medications: Prior to Admission medications   Medication Sig Start Date End Date Taking? Authorizing Provider  escitalopram (LEXAPRO) 10 MG tablet Take 1 tablet (10 mg total) by mouth at bedtime. 03/04/18  Yes Vena Austria, MD  etonogestrel (NEXPLANON) 68 MG IMPL implant 1 each by Subdermal  route once.   Yes [provider]    Physical Exam Vitals: Blood pressure 124/84, pulse 76, height 5\' 5"  (1.651 m), weight 227 lb (103 kg), not currently breastfeeding. General: NAD HEENT: normocephalic, anicteric Pulmonary: CTAB No increased work of breathing Cardiovascular: RRR, distal pulses 2+ Abdomen: soft, non-tender, non-distended Extremities: no edema, erythema, or tenderness Neurologic: Grossly intact Psychiatric: mood appropriate, affect full  Imaging No results found.  Assessment: 33 y.o. [id number] presenting for scheduled TLH, BS, cystoscopy  Plan: 1)Patient opts for definitive surgical management via hysterectomy. The risks of surgery were discussed in detail with the patient including but not limited to: bleeding which may require transfusion or reoperation; infection  which may require antibiotics; injury to bowel, bladder, ureters or other surrounding organs (With a literature reported rate of urinary tract injury of 1% quoted); need for additional procedures including laparotomy; thromboembolic phenomenon, incisional problems and other postoperative/anesthesia complications.  Patient was also advised that recovery procedure generally involves an overnight stay; and the  expected recovery time after a hysterectomy being in the range of 6-8 weeks.  Likelihood of success in alleviating the patient's symptoms was discussed.  While definitive in regards to issues with menstural bleeding, pelvic pain if present preoperatively may continue and in fact worsen postoperatively.  She is aware that the procedure will render her unable to pursue childbearing in the future.   She was told that she will be contacted by our surgical scheduler regarding the time and date of her surgery; routine preoperative instructions of having nothing to eat or drink after midnight on the day prior to surgery and also coming to the hospital 1.5 hours prior to her time of surgery were also emphasized.  She was told she may be called for a preoperative appointment about a week prior to surgery and will be given further preoperative instructions at that visit.  Routine postoperative instructions will be reviewed with the patient and her family in detail after surgery. Printed patient education handouts about the procedure was given to the patient to review at home.  2) Routine postoperative instructions were reviewed with the patient and her family in detail today including the expected length of recovery and likely postoperative course.  The patient concurred with the proposed plan, giving informed written consent for the surgery today.  Patient instructed on the importance of being NPO after midnight prior to her procedure.  If warranted preoperative prophylactic antibiotics and SCDs ordered on call to the  OR to meet SCIP guidelines and adhere to recommendation laid forth in ACOG Practice Bulletin Number 104 May 2009  "Antibiotic Prophylaxis for Gynecologic Procedures".     Vena Austria, MD, [facility], [facility] 05/21/2018, 9:43 AM

## 2018-05-21 NOTE — Addendum Note (Signed)
Addended by: Lorrene Reid on: 05/21/2018 10:02 AM   Modules accepted: Level of Service

## 2018-05-28 ENCOUNTER — Ambulatory Visit: Payer: PRIVATE HEALTH INSURANCE | Admitting: Anesthesiology

## 2018-05-28 ENCOUNTER — Ambulatory Visit
Admission: RE | Admit: 2018-05-28 | Discharge: 2018-05-28 | Disposition: A | Discharge status: Home / Self Care | Payer: PRIVATE HEALTH INSURANCE | Source: Ambulatory Visit | Attending: Obstetrics and Gynecology | Admitting: Obstetrics and Gynecology

## 2018-05-28 ENCOUNTER — Encounter: Payer: Self-pay | Admitting: *Deleted

## 2018-05-28 ENCOUNTER — Encounter
Admission: RE | Disposition: A | Discharge status: Home / Self Care | Payer: Self-pay | Source: Ambulatory Visit | Attending: Obstetrics and Gynecology

## 2018-05-28 ENCOUNTER — Other Ambulatory Visit: Payer: Self-pay

## 2018-05-28 DIAGNOSIS — F329 Major depressive disorder, single episode, unspecified: Secondary | ICD-10-CM | POA: Insufficient documentation

## 2018-05-28 DIAGNOSIS — E669 Obesity, unspecified: Secondary | ICD-10-CM | POA: Diagnosis not present

## 2018-05-28 DIAGNOSIS — K219 Gastro-esophageal reflux disease without esophagitis: Secondary | ICD-10-CM | POA: Insufficient documentation

## 2018-05-28 DIAGNOSIS — N946 Dysmenorrhea, unspecified: Secondary | ICD-10-CM | POA: Diagnosis not present

## 2018-05-28 DIAGNOSIS — Z87891 Personal history of nicotine dependence: Secondary | ICD-10-CM | POA: Diagnosis not present

## 2018-05-28 DIAGNOSIS — N800 Endometriosis of uterus: Secondary | ICD-10-CM | POA: Diagnosis not present

## 2018-05-28 DIAGNOSIS — Z3046 Encounter for surveillance of implantable subdermal contraceptive: Secondary | ICD-10-CM | POA: Insufficient documentation

## 2018-05-28 DIAGNOSIS — Z90710 Acquired absence of both cervix and uterus: Secondary | ICD-10-CM

## 2018-05-28 DIAGNOSIS — N838 Other noninflammatory disorders of ovary, fallopian tube and broad ligament: Secondary | ICD-10-CM | POA: Insufficient documentation

## 2018-05-28 DIAGNOSIS — F419 Anxiety disorder, unspecified: Secondary | ICD-10-CM | POA: Diagnosis not present

## 2018-05-28 DIAGNOSIS — N939 Abnormal uterine and vaginal bleeding, unspecified: Secondary | ICD-10-CM | POA: Diagnosis present

## 2018-05-28 DIAGNOSIS — Z6841 Body Mass Index (BMI) 40.0 and over, adult: Secondary | ICD-10-CM | POA: Diagnosis not present

## 2018-05-28 DIAGNOSIS — Z3049 Encounter for surveillance of other contraceptives: Secondary | ICD-10-CM | POA: Diagnosis not present

## 2018-05-28 DIAGNOSIS — N736 Female pelvic peritoneal adhesions (postinfective): Secondary | ICD-10-CM

## 2018-05-28 DIAGNOSIS — N938 Other specified abnormal uterine and vaginal bleeding: Secondary | ICD-10-CM | POA: Diagnosis not present

## 2018-05-28 HISTORY — PX: REMOVAL OF DRUG DELIVERY IMPLANT: SHX6585

## 2018-05-28 HISTORY — PX: CYSTOSCOPY: SHX5120

## 2018-05-28 LAB — POCT PREGNANCY, URINE: Preg Test, Ur: NEGATIVE

## 2018-05-28 SURGERY — HYSTERECTOMY, TOTAL, LAPAROSCOPIC, WITH BILATERAL SALPINGO-OOPHORECTOMY
Anesthesia: General

## 2018-05-28 MED ORDER — SUCCINYLCHOLINE CHLORIDE 20 MG/ML IJ SOLN
INTRAMUSCULAR | Status: AC
  Filled 2018-05-28: qty 1

## 2018-05-28 MED ORDER — LIDOCAINE HCL (PF) 2 % IJ SOLN
INTRAMUSCULAR | Status: AC
  Filled 2018-05-28: qty 10

## 2018-05-28 MED ORDER — FAMOTIDINE 20 MG PO TABS
20.0000 mg | ORAL_TABLET | Freq: Once | ORAL | Status: AC
  Administered 2018-05-28: 20 mg via ORAL

## 2018-05-28 MED ORDER — IBUPROFEN 600 MG PO TABS: 600.0000 mg | ORAL_TABLET | Freq: Four times a day (QID) | ORAL | 3 refills | Status: DC | PRN

## 2018-05-28 MED ORDER — ACETAMINOPHEN 10 MG/ML IV SOLN
INTRAVENOUS | Status: DC | PRN
  Administered 2018-05-28: 1000 mg via INTRAVENOUS

## 2018-05-28 MED ORDER — FENTANYL CITRATE (PF) 100 MCG/2ML IJ SOLN
INTRAMUSCULAR | Status: AC
  Administered 2018-05-28: 25 ug via INTRAVENOUS
  Filled 2018-05-28: qty 2

## 2018-05-28 MED ORDER — LACTATED RINGERS IV SOLN
INTRAVENOUS | Status: DC
  Administered 2018-05-28 (×2): via INTRAVENOUS

## 2018-05-28 MED ORDER — ROCURONIUM BROMIDE 100 MG/10ML IV SOLN
INTRAVENOUS | Status: DC | PRN
  Administered 2018-05-28: 10 mg via INTRAVENOUS
  Administered 2018-05-28: 50 mg via INTRAVENOUS
  Administered 2018-05-28: 30 mg via INTRAVENOUS

## 2018-05-28 MED ORDER — ONDANSETRON HCL 4 MG/2ML IJ SOLN
INTRAMUSCULAR | Status: DC | PRN
  Administered 2018-05-28: 4 mg via INTRAVENOUS

## 2018-05-28 MED ORDER — SUGAMMADEX SODIUM 200 MG/2ML IV SOLN
INTRAVENOUS | Status: DC | PRN
  Administered 2018-05-28: 200 mg via INTRAVENOUS

## 2018-05-28 MED ORDER — OXYCODONE HCL 5 MG PO TABS
ORAL_TABLET | ORAL | Status: AC
  Filled 2018-05-28: qty 1

## 2018-05-28 MED ORDER — SUGAMMADEX SODIUM 200 MG/2ML IV SOLN
INTRAVENOUS | Status: AC
  Filled 2018-05-28: qty 2

## 2018-05-28 MED ORDER — SEVOFLURANE IN SOLN
RESPIRATORY_TRACT | Status: AC
  Filled 2018-05-28: qty 250

## 2018-05-28 MED ORDER — OXYCODONE-ACETAMINOPHEN 5-325 MG PO TABS: 1.0000 | ORAL_TABLET | Freq: Four times a day (QID) | ORAL | 0 refills | Status: DC | PRN

## 2018-05-28 MED ORDER — PROMETHAZINE HCL 25 MG/ML IJ SOLN: 6.2500 mg | INTRAMUSCULAR | Status: DC | PRN

## 2018-05-28 MED ORDER — PROPOFOL 10 MG/ML IV BOLUS
INTRAVENOUS | Status: AC
  Filled 2018-05-28: qty 20

## 2018-05-28 MED ORDER — FENTANYL CITRATE (PF) 250 MCG/5ML IJ SOLN
INTRAMUSCULAR | Status: AC
  Filled 2018-05-28: qty 5

## 2018-05-28 MED ORDER — OXYCODONE HCL 5 MG/5ML PO SOLN: 5.0000 mg | Freq: Once | ORAL | Status: AC | PRN

## 2018-05-28 MED ORDER — ONDANSETRON HCL 4 MG/2ML IJ SOLN
INTRAMUSCULAR | Status: AC
  Filled 2018-05-28: qty 2

## 2018-05-28 MED ORDER — DEXAMETHASONE SODIUM PHOSPHATE 4 MG/ML IJ SOLN
INTRAMUSCULAR | Status: AC
  Filled 2018-05-28: qty 1

## 2018-05-28 MED ORDER — DEXAMETHASONE SODIUM PHOSPHATE 10 MG/ML IJ SOLN
INTRAMUSCULAR | Status: DC | PRN
  Administered 2018-05-28: 4 mg via INTRAVENOUS

## 2018-05-28 MED ORDER — MEPERIDINE HCL 50 MG/ML IJ SOLN: 6.2500 mg | INTRAMUSCULAR | Status: DC | PRN

## 2018-05-28 MED ORDER — MIDAZOLAM HCL 2 MG/2ML IJ SOLN
INTRAMUSCULAR | Status: AC
  Filled 2018-05-28: qty 2

## 2018-05-28 MED ORDER — FENTANYL CITRATE (PF) 100 MCG/2ML IJ SOLN
25.0000 ug | INTRAMUSCULAR | Status: DC | PRN
  Administered 2018-05-28 (×4): 25 ug via INTRAVENOUS

## 2018-05-28 MED ORDER — FENTANYL CITRATE (PF) 100 MCG/2ML IJ SOLN
INTRAMUSCULAR | Status: DC | PRN
  Administered 2018-05-28 (×2): 25 ug via INTRAVENOUS
  Administered 2018-05-28 (×2): 50 ug via INTRAVENOUS
  Administered 2018-05-28: 100 ug via INTRAVENOUS

## 2018-05-28 MED ORDER — MIDAZOLAM HCL 2 MG/2ML IJ SOLN
INTRAMUSCULAR | Status: DC | PRN
  Administered 2018-05-28: 2 mg via INTRAVENOUS

## 2018-05-28 MED ORDER — CEFAZOLIN SODIUM-DEXTROSE 2-4 GM/100ML-% IV SOLN
2.0000 g | INTRAVENOUS | Status: AC
  Administered 2018-05-28: 2 g via INTRAVENOUS

## 2018-05-28 MED ORDER — ROCURONIUM BROMIDE 50 MG/5ML IV SOLN
INTRAVENOUS | Status: AC
  Filled 2018-05-28: qty 1

## 2018-05-28 MED ORDER — CEFAZOLIN SODIUM-DEXTROSE 2-4 GM/100ML-% IV SOLN
INTRAVENOUS | Status: AC
  Filled 2018-05-28: qty 100

## 2018-05-28 MED ORDER — ACETAMINOPHEN NICU IV SYRINGE 10 MG/ML
INTRAVENOUS | Status: AC
  Filled 2018-05-28: qty 1

## 2018-05-28 MED ORDER — LIDOCAINE HCL (CARDIAC) PF 100 MG/5ML IV SOSY
PREFILLED_SYRINGE | INTRAVENOUS | Status: DC | PRN
  Administered 2018-05-28: 100 mg via INTRAVENOUS

## 2018-05-28 MED ORDER — PROPOFOL 10 MG/ML IV BOLUS
INTRAVENOUS | Status: DC | PRN
  Administered 2018-05-28: 200 mg via INTRAVENOUS

## 2018-05-28 MED ORDER — BUPIVACAINE HCL 0.5 % IJ SOLN
INTRAMUSCULAR | Status: DC | PRN
  Administered 2018-05-28: 8 mL
  Administered 2018-05-28: 9 mL

## 2018-05-28 MED ORDER — FAMOTIDINE 20 MG PO TABS
ORAL_TABLET | ORAL | Status: AC
  Filled 2018-05-28: qty 1

## 2018-05-28 MED ORDER — OXYCODONE HCL 5 MG PO TABS
5.0000 mg | ORAL_TABLET | Freq: Once | ORAL | Status: AC | PRN
  Administered 2018-05-28: 5 mg via ORAL

## 2018-05-28 SURGICAL SUPPLY — 70 items
APPLICATOR ARISTA FLEXITIP XL (MISCELLANEOUS) ×2 IMPLANT
BAG URINE DRAINAGE (UROLOGICAL SUPPLIES) ×4 IMPLANT
BLADE SURG 15 STRL LF DISP TIS (BLADE) ×2 IMPLANT
BLADE SURG 15 STRL SS (BLADE) ×2
BLADE SURG SZ11 CARB STEEL (BLADE) ×6 IMPLANT
CANISTER SUCT 1200ML W/VALVE (MISCELLANEOUS) ×4 IMPLANT
CATH FOLEY 2WAY  5CC 16FR (CATHETERS) ×2
CATH URTH 16FR FL 2W BLN LF (CATHETERS) ×2 IMPLANT
CHLORAPREP W/TINT 26ML (MISCELLANEOUS) ×4 IMPLANT
COUNTER NEEDLE 20/40 LG (NEEDLE) ×4 IMPLANT
COVER PROBE FLX POLY STRL (MISCELLANEOUS) ×2 IMPLANT
COVER WAND RF STERILE (DRAPES) ×4 IMPLANT
DEFOGGER SCOPE WARMER CLEARIFY (MISCELLANEOUS) ×4 IMPLANT
DERMABOND ADVANCED (GAUZE/BANDAGES/DRESSINGS) ×4
DERMABOND ADVANCED .7 DNX12 (GAUZE/BANDAGES/DRESSINGS) ×2 IMPLANT
DEVICE SUTURE ENDOST 10MM (ENDOMECHANICALS) ×6 IMPLANT
DRAPE SHEET LG 3/4 BI-LAMINATE (DRAPES) ×4 IMPLANT
GAUZE 4X4 16PLY RFD (DISPOSABLE) ×4 IMPLANT
GLOVE BIO SURGEON STRL SZ7 (GLOVE) ×16 IMPLANT
GLOVE INDICATOR 7.5 STRL GRN (GLOVE) ×10 IMPLANT
GOWN STRL REUS W/ TWL LRG LVL3 (GOWN DISPOSABLE) ×4 IMPLANT
GOWN STRL REUS W/ TWL XL LVL3 (GOWN DISPOSABLE) ×2 IMPLANT
GOWN STRL REUS W/TWL LRG LVL3 (GOWN DISPOSABLE) ×4
GOWN STRL REUS W/TWL XL LVL3 (GOWN DISPOSABLE) ×2
GRASPER SUT TROCAR 14GX15 (MISCELLANEOUS) ×2 IMPLANT
HEMOSTAT ARISTA ABSORB 1G (HEMOSTASIS) ×2 IMPLANT
IRRIGATION STRYKERFLOW (MISCELLANEOUS) ×2 IMPLANT
IRRIGATOR STRYKERFLOW (MISCELLANEOUS)
IV LACTATED RINGERS 1000ML (IV SOLUTION) ×4 IMPLANT
IV NS 1000ML (IV SOLUTION) ×2
IV NS 1000ML BAXH (IV SOLUTION) ×2 IMPLANT
KIT PINK PAD W/HEAD ARE REST (MISCELLANEOUS) ×4
KIT PINK PAD W/HEAD ARM REST (MISCELLANEOUS) ×2 IMPLANT
LABEL OR SOLS (LABEL) ×4 IMPLANT
MANIPULATOR VCARE LG CRV RETR (MISCELLANEOUS) ×2 IMPLANT
MANIPULATOR VCARE SML CRV RETR (MISCELLANEOUS) IMPLANT
MANIPULATOR VCARE STD CRV RETR (MISCELLANEOUS) IMPLANT
NS IRRIG 1000ML POUR BTL (IV SOLUTION) ×4 IMPLANT
NS IRRIG 500ML POUR BTL (IV SOLUTION) ×4 IMPLANT
OCCLUDER COLPOPNEUMO (BALLOONS) ×2 IMPLANT
PACK BASIN MINOR ARMC (MISCELLANEOUS) ×2 IMPLANT
PACK GYN LAPAROSCOPIC (MISCELLANEOUS) ×4 IMPLANT
PAD OB MATERNITY 4.3X12.25 (PERSONAL CARE ITEMS) ×4 IMPLANT
PAD PREP 24X41 OB/GYN DISP (PERSONAL CARE ITEMS) ×4 IMPLANT
PORT ACCESS TROCAR AIRSEAL 12 (TROCAR) IMPLANT
PORT ACCESS TROCAR AIRSEAL 5M (TROCAR) ×2
SCISSORS METZENBAUM CVD 33 (INSTRUMENTS) ×4 IMPLANT
SET CYSTO W/LG BORE CLAMP LF (SET/KITS/TRAYS/PACK) ×6 IMPLANT
SET TRI-LUMEN FLTR TB AIRSEAL (TUBING) ×2 IMPLANT
SHEARS HARMONIC ACE PLUS 36CM (ENDOMECHANICALS) ×4 IMPLANT
SLEEVE ENDOPATH XCEL 5M (ENDOMECHANICALS) ×4 IMPLANT
SOLUTION ELECTROLUBE (MISCELLANEOUS) ×2 IMPLANT
SPONGE LAP 18X18 RF (DISPOSABLE) ×4 IMPLANT
STOCKINETTE 48X4 2 PLY STRL (GAUZE/BANDAGES/DRESSINGS) ×2 IMPLANT
STOCKINETTE STRL 4IN 9604848 (GAUZE/BANDAGES/DRESSINGS) ×4 IMPLANT
STRAP SAFETY 5IN WIDE (MISCELLANEOUS) ×4 IMPLANT
SURGILUBE 2OZ TUBE FLIPTOP (MISCELLANEOUS) ×4 IMPLANT
SUT ENDO VLOC 180-0-8IN (SUTURE) ×6 IMPLANT
SUT MNCRL 4-0 (SUTURE) ×4
SUT MNCRL 4-0 27XMFL (SUTURE) ×4
SUT VIC AB 0 CT1 27 (SUTURE)
SUT VIC AB 0 CT1 27XCR 8 STRN (SUTURE) ×2 IMPLANT
SUT VIC AB 2-0 UR6 27 (SUTURE) ×2 IMPLANT
SUTURE MNCRL 4-0 27XMF (SUTURE) ×4 IMPLANT
SYR 10ML LL (SYRINGE) ×4 IMPLANT
SYR 50ML LL SCALE MARK (SYRINGE) ×4 IMPLANT
TOWEL OR 17X26 4PK STRL BLUE (TOWEL DISPOSABLE) ×4 IMPLANT
TROCAR ENDO BLADELESS 11MM (ENDOMECHANICALS) ×4 IMPLANT
TROCAR XCEL NON-BLD 5MMX100MML (ENDOMECHANICALS) ×4 IMPLANT
TUBING EVAC SMOKE HEATED PNEUM (TUBING) ×2 IMPLANT

## 2018-05-28 NOTE — Anesthesia Preprocedure Evaluation (Signed)
Anesthesia Evaluation  Patient identified by MRN, date of birth, ID band Patient awake    Reviewed: Allergy & Precautions, NPO status , Patient's Chart, lab work & pertinent test results  History of Anesthesia Complications (+) PONV and history of anesthetic complications (had nausea during csection)  Airway Mallampati: II  TM Distance: >3 FB Neck ROM: Full    Dental no notable dental hx.    Pulmonary neg sleep apnea, neg COPD, former smoker,    breath sounds clear to auscultation- rhonchi (-) wheezing      Cardiovascular Exercise Tolerance: Good (-) hypertension(-) CAD, (-) Past MI, (-) Cardiac Stents and (-) CABG  Rhythm:Regular Rate:Normal - Systolic murmurs and - Diastolic murmurs    Neuro/Psych  Headaches, PSYCHIATRIC DISORDERS Anxiety Depression    GI/Hepatic Neg liver ROS, GERD  ,  Endo/Other  negative endocrine ROSneg diabetes  Renal/GU negative Renal ROS     Musculoskeletal negative musculoskeletal ROS (+)   Abdominal (+) + obese,   Peds  Hematology negative hematology ROS (+)   Anesthesia Other Findings Past Medical History: No date: Allergy     Comment:  history of hives controoled with zyrtec No date: Anxiety No date: Depression No date: GERD (gastroesophageal reflux disease) No date: Migraines 2013: PONV (postoperative nausea and vomiting)     Comment:  from epidural from c-section   Reproductive/Obstetrics                             Anesthesia Physical Anesthesia Plan  ASA: II  Anesthesia Plan: General   Post-op Pain Management:    Induction: Intravenous  PONV Risk Score and Plan: 3 and Ondansetron, Dexamethasone and Midazolam  Airway Management Planned: Oral ETT  Additional Equipment:   Intra-op Plan:   Post-operative Plan: Extubation in OR  Informed Consent: I have reviewed the patients History and Physical, chart, labs and discussed the procedure  including the risks, benefits and alternatives for the proposed anesthesia with the patient or authorized representative who has indicated his/her understanding and acceptance.     Dental advisory given  Plan Discussed with: CRNA and Anesthesiologist  Anesthesia Plan Comments:         Anesthesia Quick Evaluation

## 2018-05-28 NOTE — Anesthesia Post-op Follow-up Note (Signed)
Anesthesia QCDR form completed.        

## 2018-05-28 NOTE — H&P (Signed)
Date of Initial H&P: 05/21/2018  History reviewed, patient examined, no change in status, stable for surgery. 

## 2018-05-28 NOTE — Anesthesia Procedure Notes (Addendum)
Procedure Name: Intubation Date/Time: 05/28/2018 10:50 AM Performed by: Dava Najjar, CRNA Pre-anesthesia Checklist: Patient identified, Emergency Drugs available, Suction available and Patient being monitored Patient Re-evaluated:Patient Re-evaluated prior to induction Oxygen Delivery Method: Circle system utilized Preoxygenation: Pre-oxygenation with 100% oxygen Induction Type: IV induction Ventilation: Mask ventilation without difficulty Laryngoscope Size: McGraph and 3 Grade View: Grade I Tube type: Oral Tube size: 7.0 mm Number of attempts: 1 Airway Equipment and Method: Stylet and Video-laryngoscopy Placement Confirmation: ETT inserted through vocal cords under direct vision,  positive ETCO2 and breath sounds checked- equal and bilateral Secured at: 20 cm Tube secured with: Tape Dental Injury: Teeth and Oropharynx as per pre-operative assessment

## 2018-05-28 NOTE — Discharge Instructions (Signed)

## 2018-05-28 NOTE — Anesthesia Postprocedure Evaluation (Signed)
Anesthesia Post Note  Patient: DUTCHESS IGNACIO  Procedure(s) Performed: TOTAL LAPAROSCOPIC HYSTERECTOMY WITH BILATERAL SALPINGO OOPHORECTOMY (N/A ) CYSTOSCOPY (N/A ) REMOVAL OF DRUG DELIVERY IMPLANT-NEXPLANON (Left )  Patient location during evaluation: PACU Anesthesia Type: General Level of consciousness: awake and alert and oriented Pain management: pain level controlled Vital Signs Assessment: post-procedure vital signs reviewed and stable Respiratory status: spontaneous breathing, nonlabored ventilation and respiratory function stable Cardiovascular status: blood pressure returned to baseline and stable Postop Assessment: no signs of nausea or vomiting Anesthetic complications: no     Last Vitals:  Vitals:   05/28/18 1358 05/28/18 1409  BP: 112/71 124/74  Pulse: 74 75  Resp:  16  Temp:    SpO2: 98% 98%    Last Pain:  Vitals:   05/28/18 1409  TempSrc:   PainSc: 4                  Blu Mcglaun

## 2018-05-28 NOTE — Op Note (Addendum)
Preoperative Diagnosis: 1) 33 y.o. with abnormal uterine bleeding and dsymenorrhea  Postoperative Diagnosis: 1) 33 y.o. with abnormal uterine bleeding and dsymenorrhea 2) Adhesive disease  Operation Performed: Total laparoscopic hysterectomy, bilateral salpingectomy, cystoscopy, and Nexplanon removal  Indication: Abnormal uterine bleeding and dysmenorrhea unresponsive to medical management  Surgeon: Vena Austria, MD  Assistant: Dr. Annamarie Major - this surgery required a high level surgical assistant with none other readily available  Anesthesia: General  Preoperative Antibiotics: 2g Ancef  Estimated Blood Loss: 150 mL  IV Fluids:  Urine Output::  Drains or Tubes: none  Implants: none  Specimens Removed: Uterus, cervix, and bilateral fallopian tubes.  Nexplanon implant  Complications: none  Intraoperative Findings: Uterus with filmy adhesion to anterior abdominal wall, significant bladder scarring from prior cesarean section, and left pelvic sidewall.  Normal ovaries and tubes.    Patient Condition: stable  Procedure in Detail:  Patient was taken to the operating room where she was administered general anesthesia.  She was positioned in the dorsal lithotomy position utilizing Allen stirups, prepped and draped in the usual sterile fashion.  Prior to proceeding with procedure a time out was performed.  Implanon identified without problems in the left arm. Chlorhexidine scrub applied.  3 ml of 1% lidocaine injected under implanon device without problems.  Sterile gloves applied.  Small 0.5cm incision made at distal tip of implanon device with 11 blade scalpel.  Implanon brought to incision and grasped with a small kelly clamp.  Implanon removed intact without problems.  Pressure applied to incision.  Hemostasis obtained.  Steri-strips applied, followed by bandage and compression dressing.   Attention was turned to the patient's pelvis.  An indwelling foley catheter  was placed to decompress the patient's bladder.  An operative speculum was placed to allow visualization of the cervix.  The anterior lip of the cervix was grasped with a single tooth tenaculum, and a large V-care uterine manipulator was placed to allow manipulation of the uterus.  The operative speculum and single tooth tenaculum were then removed.  Attention was turned to the patient's abdomen.  The umbilicus was infiltrated with 1% Sensorcaine, before making a stab incision using an 11 blade scalpel.  A 23mm Excel trocar was then used to gain direct entry into the peritoneal cavity utilizing the camera to visualize progress of the trocar during placement.  Once peritoneal entry had been achieved, insufflation was started and pneumoperitoneum established at a pressure of .    One left and one right lower quadrant site were then injected with 1% Sensorcaine and a stab incision was made using an 11 blade scalpel.  Two additional 78mm Excel trocars were placed through these incisions, while the umbilical trocar was stepped up to a 19mm trocar under direct visualization. General inspection of the abdomen revealed the above noted findings.   The right tube was identified and grasped at its fimbriated end.  The tube was transected from its attachments to the ovary and mesosalpinx using a 48mm Harmonic scalpel.  The utero ovarian ligament was identified ligated and transected using the Harmonic scalpel. The round ligament was then likewise ligated and transected.  The anterior leaf of the broad ligament was dissected down to the level of the internal cervical os and a bladder flap was started.  The posterior leaf of the broad ligament was dissected down to the utero-sacral ligament.  The uterine artery was skeletonized before being ligated and transected using the Harmonic scalpel with cephelad pressure applied to the  V-care device to assure lateralization of the ureter.  A bite was then taken with Harmonic medial  to transected portio of uterine artery to further lateralize the ureter and vessel off the V-care cup.  The patient left adnexal structures were then dissected in similar fashion with some additional adhesive disease noted.  After incising the broad ligament serial bites were taken medical to the first to drop the adhesive disease laterally allowing cauterization and transection of the uterine artery.  The bladder flap was completed and the bladder mobilized off the V-care cup.  An anterior colpotomy was scores and carried around in a clockwise fashion to free the specimen, which was then removed vaginally.  Inspection revealed all pedicles to be hemostatic before proceed with vaginal closure. An Endostitch using a V-loc load was used to close the cuff in a running fashion.  The cuff was noted to be hemostatic following closure with no palpable defects on digital exam.  Arista was applied to all pedicles.  The 79mm trocar site was closed using 0 Vicryl and Carter-Thompson.  No palpable fascial defects noted.  10mm port site closed with 4-0 Vicryl in a subcuticular fashion.  All trocar sites then dressed with skin glue.  The indwelling foley catheter was removed.  Cystoscopy was performed noting and intact bladder dome as well as brisk efflux of urine from bother ureteral orifices.  The cystoscopy was removed and the indwelling foley catheter was not replaced.  Sponge needle and instrument counts were correct time two.  The patient tolerated the procedure well and was taken to the recovery room in stable condition.

## 2018-05-28 NOTE — Transfer of Care (Signed)
Immediate Anesthesia Transfer of Care Note  Patient: Jamie Weber  Procedure(s) Performed: TOTAL LAPAROSCOPIC HYSTERECTOMY WITH BILATERAL SALPINGO OOPHORECTOMY (N/A ) CYSTOSCOPY (N/A ) REMOVAL OF DRUG DELIVERY IMPLANT-NEXPLANON (Left )  Patient Location: PACU  Anesthesia Type:General  Level of Consciousness: awake, alert , oriented and patient cooperative  Airway & Oxygen Therapy: Patient Spontanous Breathing and Patient connected to face mask oxygen  Post-op Assessment: Report given to RN and Post -op Vital signs reviewed and stable  Post vital signs: Reviewed and stable  Last Vitals:  Vitals Value Taken Time  BP 116/91 05/28/2018 12:48 PM  Temp 37.2 C 05/28/2018 12:48 PM  Pulse 95 05/28/2018 12:51 PM  Resp 22 05/28/2018 12:51 PM  SpO2 100 % 05/28/2018 12:51 PM  Vitals shown include unvalidated device data.  Last Pain:  Vitals:   05/28/18 0945  TempSrc: Temporal  PainSc: 0-No pain         Complications: No apparent anesthesia complications

## 2018-05-29 ENCOUNTER — Encounter: Payer: Self-pay | Admitting: Obstetrics and Gynecology

## 2018-05-30 LAB — SURGICAL PATHOLOGY

## 2018-06-04 ENCOUNTER — Encounter: Payer: Self-pay | Admitting: Obstetrics and Gynecology

## 2018-06-04 ENCOUNTER — Ambulatory Visit (INDEPENDENT_AMBULATORY_CARE_PROVIDER_SITE_OTHER): Payer: PRIVATE HEALTH INSURANCE | Admitting: Obstetrics and Gynecology

## 2018-06-04 VITALS — BP 114/52 | HR 74 | Wt 231.0 lb

## 2018-06-04 DIAGNOSIS — Z4889 Encounter for other specified surgical aftercare: Secondary | ICD-10-CM

## 2018-06-06 NOTE — Progress Notes (Signed)
Postoperative Follow-up Patient presents post op from TLH, BS, cystoscopy, nexplanon removal 1weeks ago for abnormal uterine bleeding and dysmenorrhea.  Subjective: Patient reports some improvement in her preop symptoms. Eating a regular diet without difficulty. Pain is controlled without any medications.  Activity: normal activities of daily living.  Objective: Blood pressure (!) 114/52, pulse 74, weight 231 lb (104.8 kg), last menstrual period 12/17/2017, not currently breastfeeding.  General: NAD Pulmonary: no increased work of breathing Abdomen: soft, non-tender, non-distended, incision(s) D/C/I Extremities: no edema Neurologic: normal gait    Admission on 05/28/2018, Discharged on 05/28/2018  Component Date Value Ref Range Status  . Preg Test, Ur 05/28/2018 NEGATIVE  NEGATIVE Final   Comment:        THE SENSITIVITY OF THIS METHODOLOGY IS >24 mIU/mL   . SURGICAL PATHOLOGY 05/28/2018    Final                   Value:Surgical Pathology CASE: [id number] PATIENT: [facility] Surgical Pathology Report     SPECIMEN SUBMITTED: A. Uterus with cervix, bilateral tubes  CLINICAL HISTORY: Abnormal uterine bleeding  PRE-OPERATIVE DIAGNOSIS: Abnormal uterine bleeding  POST-OPERATIVE DIAGNOSIS: Same as pre op     DIAGNOSIS: A. UTERUS WITH CERVIX, BILATERAL FALLOPIAN TUBES; HYSTERECTOMY AND BILATERAL SALPINGECTOMY: - BENIGN ECTOCERVICAL AND ENDOCERVICAL MUCOSA. - BENIGN PROLIFERATIVE ENDOMETRIUM AND BENIGN MYOMETRIUM WITH SCAR. - FOCAL ADENOMYOSIS. - BILATERAL BENIGN FALLOPIAN TUBES. - BENIGN RIGHT PARATUBAL CYST.   GROSS DESCRIPTION: A. Labeled: Uterus with cervix, bilateral tubes Received: Formalin Weight: 131 grams (total) Dimensions:      Uterus - 6.7 x 6.5 x 4.5 cm      Cervix - 4.2 x 3.5 x 2.7 cm Serosa: Tan and smooth Cervix: The ectocervical mucosa is pale tan and smooth.  The external os is slit-like, patent, and 1.5 cm in diameter.  The  anterior cervical                          soft tissue resection margin is inked blue, and the posterior cervical soft tissue resection margin is inked black. Endocervix: 3.2 cm in length by 1.4 cm in diameter.  The endocervical mucosa is tan, striated, and grossly unremarkable. Endometrial cavity:      Dimensions - 4.7 cm in length by 2.5 cm cornu to cornu width      Thickness - 0.1-0.3 cm      Other findings - Tan-red and smooth.  No polyps or abnormalities are grossly identified. Myometrium:     Thickness - 1.8 cm (average)     Other findings - Sectioning displays tan-pink, grossly unremarkable myometrium with no nodules or abnormalities identified. Adnexa:      Right fallopian tube           Measurements - 7.5 cm in length x 0.5 cm in diameter           Other findings - Fimbriated.  The external surface is tan-purple and smooth.  Sectioning displays a pinpoint, grossly unremarkable lumen.      Left fallopian tube            Measurements - 7.0 cm in length x 0.4 cm in diameter           Other findings -                          Fimbriated.  The external surface is tan-purple  and smooth.  Sectioning displays a pinpoint, grossly unremarkable lumen.  Block summary: 1-4 -12 to 3:00 cervix 5-7 - 3 to 6:00 cervix 8-10 - 6 to 9:00 cervix 11-13 - 9 to 12:00 cervix 14 - anterior lower uterine segment 15 - posterior lower uterine segment 16 - anterior endomyometrium 17 - posterior endomyometrium 18 - right fallopian tube with longitudinally sectioned fimbria and cross-sections 19 - left fallopian tube with longitudinally sectioned fimbria and cross-sections   Final Diagnosis performed by Redmond Pulling, MD.   Electronically signed 05/30/2018 11:36:12AM The electronic signature indicates that the named Attending Pathologist has evaluated the specimen  Technical component performed at [facility], [address] [place], [place] [address] Lab: [phone] Dir: Jolene Schimke, MD, MMM   Professional component performed at [facility], [facility], [address] [employer], [place], [place] [address]                         5 Lab: [phone] Dir: Georgiann Cocker. Rubinas, MD     Assessment: 33 y.o. s/p TLH, BS, nexplanon removal, cystoscopy stable  Plan: Patient has done well after surgery with no apparent complications.  I have discussed the post-operative course to date, and the expected progress moving forward.  The patient understands what complications to be concerned about.  I will see the patient in routine follow up, or sooner if needed.    Activity plan: No heavy lifting.   Vena Austria, MD, [facility], [facility]

## 2018-07-09 ENCOUNTER — Ambulatory Visit: Payer: PRIVATE HEALTH INSURANCE | Admitting: Obstetrics and Gynecology

## 2018-08-27 ENCOUNTER — Telehealth: Payer: Self-pay

## 2018-08-27 NOTE — Telephone Encounter (Signed)
Pt needs a medication follow up appointment with [facility] at her earliest convenience.

## 2018-08-27 NOTE — Telephone Encounter (Signed)
Patient would like to speak to Dr. Bonney Aid regarding a vitamin she can take along with her anxiety/depression med he rx'd to her after her hysterectomy. BS#[phone]

## 2018-08-30 NOTE — Telephone Encounter (Signed)
Called and left voice mail for patient to call back to be schedule °

## 2019-01-21 ENCOUNTER — Ambulatory Visit: Payer: PRIVATE HEALTH INSURANCE | Admitting: Obstetrics and Gynecology

## 2019-02-19 ENCOUNTER — Ambulatory Visit: Payer: PRIVATE HEALTH INSURANCE | Admitting: Obstetrics and Gynecology

## 2019-03-08 ENCOUNTER — Other Ambulatory Visit: Payer: Self-pay | Admitting: Student

## 2019-03-08 DIAGNOSIS — M4802 Spinal stenosis, cervical region: Secondary | ICD-10-CM

## 2019-04-03 ENCOUNTER — Ambulatory Visit: Payer: PRIVATE HEALTH INSURANCE | Attending: Internal Medicine

## 2019-04-03 DIAGNOSIS — Z20822 Contact with and (suspected) exposure to covid-19: Secondary | ICD-10-CM

## 2019-04-06 ENCOUNTER — Telehealth: Payer: Self-pay

## 2019-04-06 NOTE — Telephone Encounter (Signed)
Pt called for covid results- advised that results are not back and to watch for MyChart notification. Pt verbalized understanding.

## 2019-04-09 ENCOUNTER — Telehealth: Payer: Self-pay

## 2019-04-09 ENCOUNTER — Encounter: Payer: Self-pay | Admitting: *Deleted

## 2019-04-09 LAB — NOVEL CORONAVIRUS, NAA

## 2019-04-09 NOTE — Telephone Encounter (Signed)
Letter sent to patient via MyChart.

## 2019-04-09 NOTE — Telephone Encounter (Signed)
Patient was tested on 04/03/2019 and needs a note for working stating that she was tested and there was a error and test was not ran.

## 2019-04-10 ENCOUNTER — Ambulatory Visit: Payer: PRIVATE HEALTH INSURANCE | Attending: Internal Medicine

## 2019-04-10 DIAGNOSIS — Z20822 Contact with and (suspected) exposure to covid-19: Secondary | ICD-10-CM

## 2019-04-12 LAB — NOVEL CORONAVIRUS, NAA: SARS-CoV-2, NAA: NOT DETECTED

## 2019-05-23 ENCOUNTER — Ambulatory Visit: Admission: RE | Admit: 2019-05-23 | Payer: PRIVATE HEALTH INSURANCE | Source: Ambulatory Visit

## 2019-06-04 ENCOUNTER — Ambulatory Visit
Admission: RE | Admit: 2019-06-04 | Discharge: 2019-06-04 | Disposition: A | Discharge status: Home / Self Care | Payer: PRIVATE HEALTH INSURANCE | Source: Ambulatory Visit | Attending: Student | Admitting: Student

## 2019-06-04 ENCOUNTER — Other Ambulatory Visit: Payer: Self-pay

## 2019-06-04 DIAGNOSIS — M4802 Spinal stenosis, cervical region: Secondary | ICD-10-CM | POA: Insufficient documentation

## 2019-06-13 IMAGING — MR MR HEAD W/O CM
10 series · 47 of 48 positions shown · non-contrast
Comparison: None available.

CLINICAL DATA: Initial evaluation for dizziness, lethargy, visual
changes, extremity tingling. Currently 19 weeks pregnant. Evaluate
for pituitary macro adenoma.

EXAM:
MRI HEAD WITHOUT CONTRAST
TECHNIQUE: Multiplanar, multiecho pulse sequences of the brain and surrounding
structures were obtained without intravenous contrast.

[Series 2: T1 · sagittal · 5.0mm · 0.45mm/px · 3 of 27 slices shown (1 of 2)]
[im 1/27]
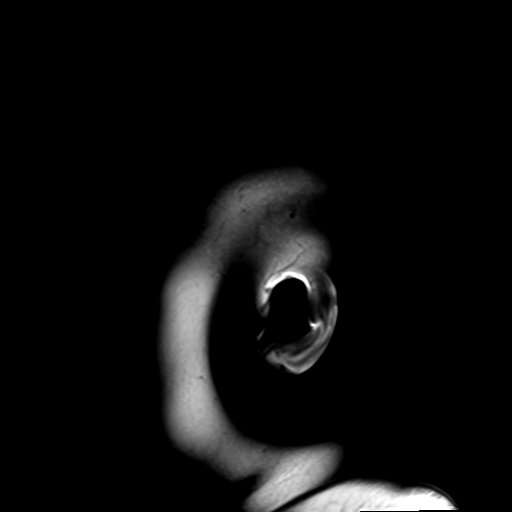
[im 14/27]
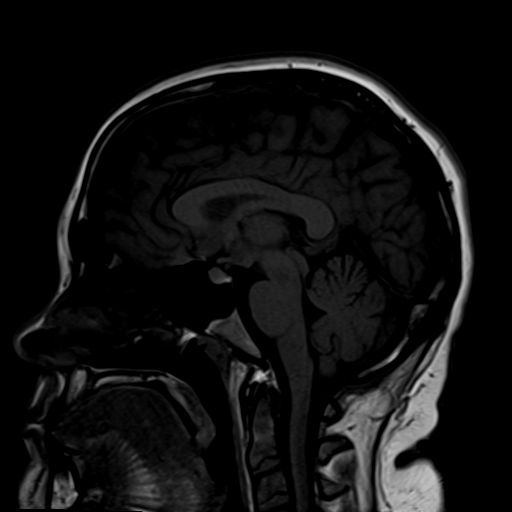
[im 27/27]
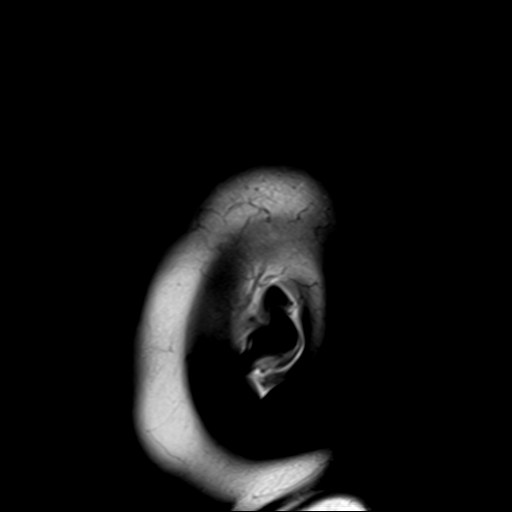

[Series 4: DWI · axial · 3.0mm · 1.80mm/px · z∈[-72,+82]mm · 5 of 53 slices shown (1 of 2)]
[im 1/53]
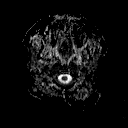
[im 14/53]
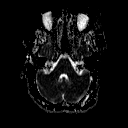
[im 27/53]
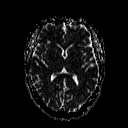
[im 40/53]
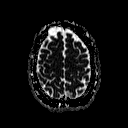
[im 53/53]
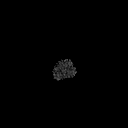

[Series 6: DWI · coronal · 3.0mm · 1.80mm/px · 4 of 47 slices shown (2 of 2)]
[im 1/47]
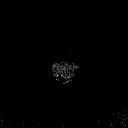
[im 16/47]
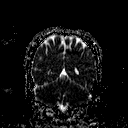
[im 31/47]
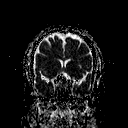
[im 47/47]
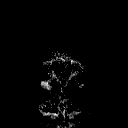

[Series 7: T2 · axial · 5.0mm · 0.60mm/px · z∈[-73,+81]mm · 2 of 25 slices shown (1 of 3)]
[im 1/25]
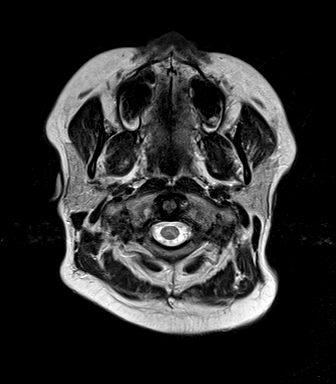
[im 25/25]
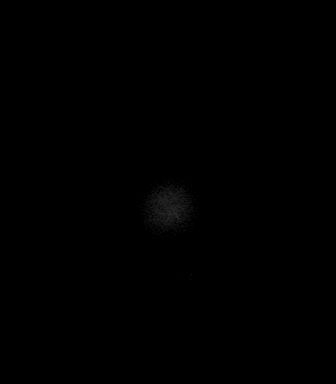

[Series 8: FLAIR · axial · 3.0mm · 0.45mm/px · z∈[-73,+81]mm · 5 of 53 slices shown]
[im 1/53]
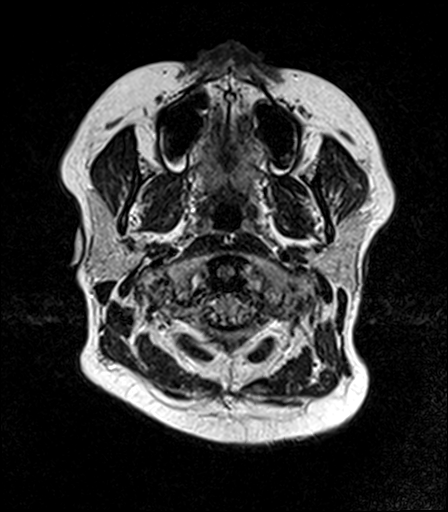
[im 14/53]
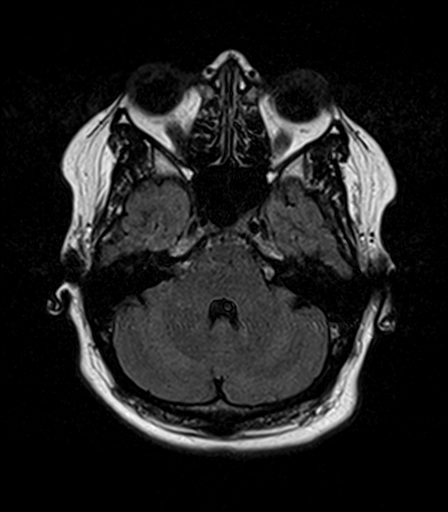
[im 27/53]
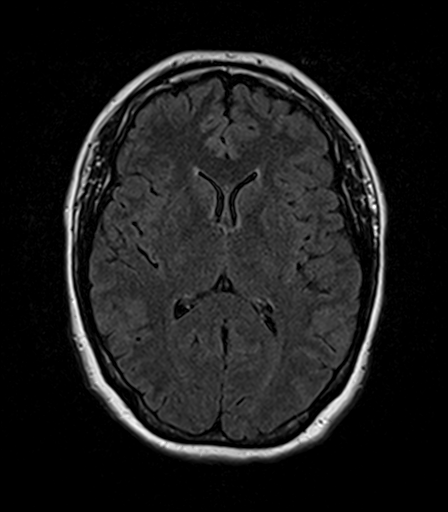
[im 40/53]
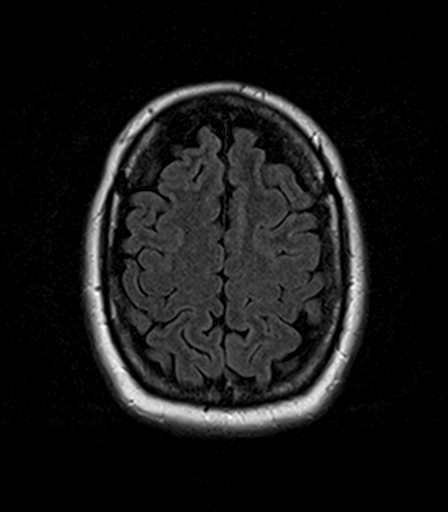
[im 53/53]
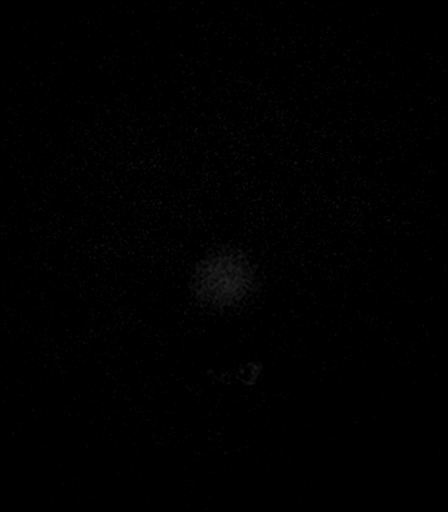

[Series 9: T2 · axial · 5.0mm · 0.45mm/px · z∈[-73,+81]mm · 2 of 25 slices shown (2 of 3)]
[im 1/25]
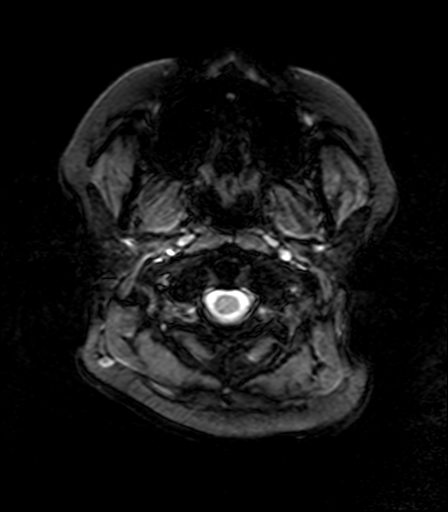
[im 25/25]
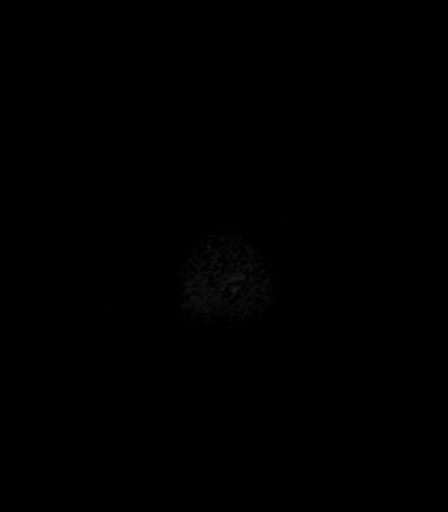

[Series 10: T1 · axial · 1.0mm · 0.77mm/px · z∈[-75,+83]mm · 14 of 160 slices shown (2 of 2)]
[im 1/160]
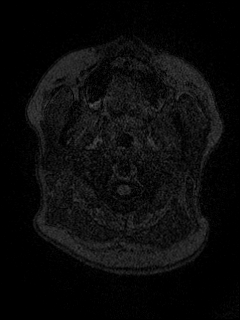
[im 12/160]
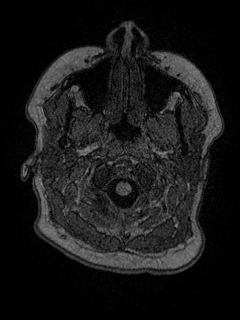
[im 23/160]
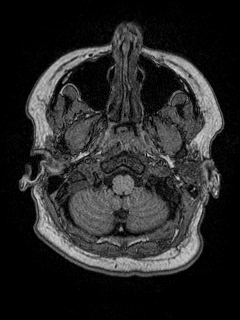
[im 35/160]
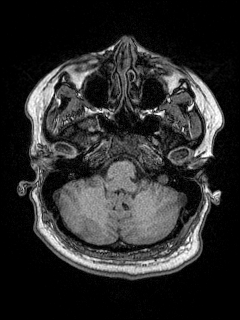
[im 46/160]
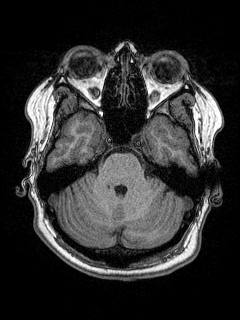
[im 57/160]
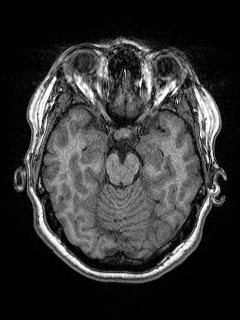
[im 69/160]
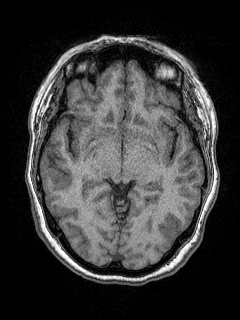
[im 80/160]
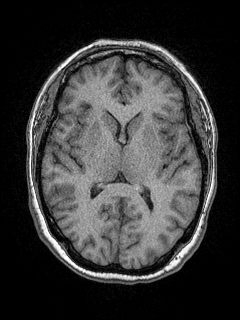
[im 91/160]
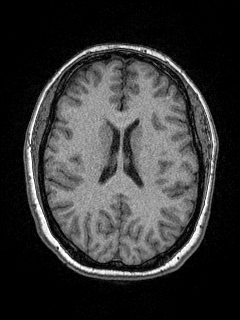
[im 103/160]
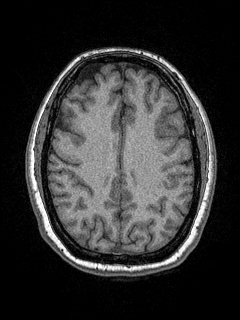
[im 114/160]
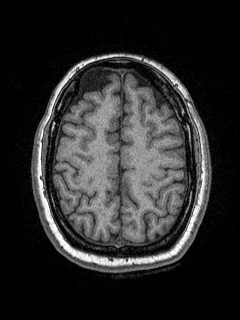
[im 125/160]
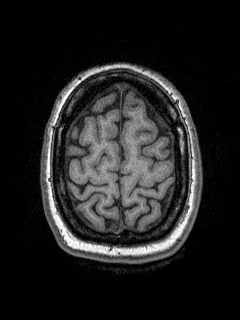
[im 137/160]
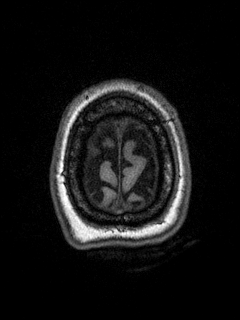
[im 160/160]
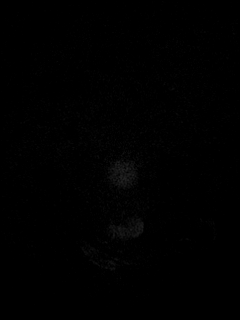

[Series 11: T2 · coronal · 5.0mm · 0.49mm/px · 3 of 29 slices shown (3 of 3)]
[im 1/29]
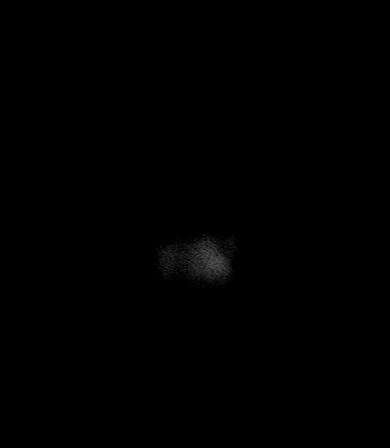
[im 15/29]
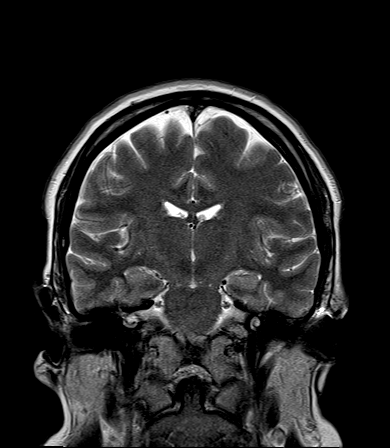
[im 29/29]
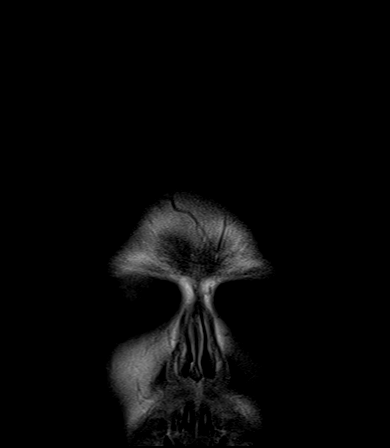

[Series 100: ax (id) · axial · 3.0mm · 1.80mm/px · z∈[-72,+82]mm · 5 of 53 slices shown]
[im 1/53]
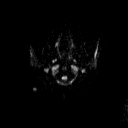
[im 14/53]
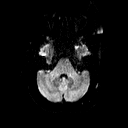
[im 27/53]
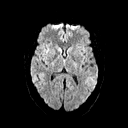
[im 40/53]
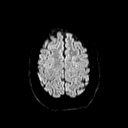
[im 53/53]
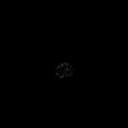

[Series 101: cor (id) · coronal · 3.0mm · 1.80mm/px · 4 of 47 slices shown]
[im 1/47]
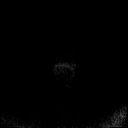
[im 16/47]
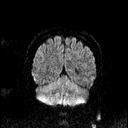
[im 31/47]
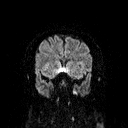
[im 47/47]
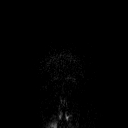

[47 of 48 positions shown; findings below may reference images not displayed]

FINDINGS: Brain: Cerebral volume normal for age. No focal parenchymal signal
abnormality. No evidence for acute or subacute ischemia. Gray-white
matter differentiation well maintained. No encephalomalacia to
suggest chronic infarction. No foci of susceptibility artifact to
suggest acute or chronic intracranial hemorrhage.

No mass lesion, midline shift or mass effect. No hydrocephalus.
Approximate 3 cm extra-axial cystic lesion overlying the anterior
right frontal convexity most consistent with a benign arachnoid cyst
(series 7, image 18). No significant mass effect. No other
extra-axial fluid collection. Major dural sinuses are grossly
patent.

Pituitary gland mildly prominent with convex border superiorly,
likely related current pregnancy. No discernible mass lesion on this
non pituitary protocol MRI. Pituitary soft grossly midline. Optic
chiasm normally position within the suprasellar cistern. Midline
structures intact and normal.

Vascular: Major intracranial vascular flow voids are well
maintained.

Skull and upper cervical spine: Craniocervical junction normal.
Upper cervical spine normal. Bone marrow signal intensity within
normal limits. No scalp soft tissue abnormality.

Sinuses/Orbits: Globes and orbital soft tissues within normal
limits. Paranasal sinuses are clear. No mastoid effusion. Inner ear
structures normal.

Other: None.
IMPRESSION: 1. No pituitary macro adenoma or other discernible pituitary
abnormality identified. Mild prominence of the pituitary gland
itself felt to be within normal limits for current pregnancy status.
If there remains clinical concern for possible occult pituitary
pathology, a follow-up examination with dedicated pituitary mass
protocol MRI, with and without gadolinium could be performed
following current pregnancy.
2. 3 cm benign arachnoid cyst overlying the right frontal convexity.
This finding is felt to be incidental in nature, and of doubtful
clinical significance.
3. Otherwise normal brain MRI.

## 2020-01-21 ENCOUNTER — Encounter: Payer: Self-pay | Admitting: Dietician

## 2020-01-21 ENCOUNTER — Other Ambulatory Visit: Payer: Self-pay

## 2020-01-21 ENCOUNTER — Encounter: Payer: PRIVATE HEALTH INSURANCE | Attending: Neurology | Admitting: Dietician

## 2020-01-21 NOTE — Progress Notes (Signed)
Medical Nutrition Therapy: Visit start time: 1050  end time: 1225  Assessment:  Diagnosis: obesity, history of eating disorder  Past medical history: migraines Psychosocial issues/ stress concerns: anxiety, depression  Preferred learning method:  . Auditory . Visual . Hands-on  Current weight: 225.2lbs Height: 5'5" Medications, supplements: reconciled list in medical record  Progress and evaluation:   Patient reports some weight gain at age 34 after moving in with grandparents. 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   She has recovered from the eating disorder, but has kept food diaries since age 64, and reports ongoing tendencies to overeat when feeling depressed and follow with food avoidance for 1 or more days after overeating or weight gain.  She reports severe depression in 2018 and binge eating which led to weight gain up to about 300lbs, then lost on phentermine. Became pregnant in 2019 and initially lost weight due to nausea and vomiting, but then gained when symptoms improved.   Tried low carb, keto diets, eliminated sugar, and lost 50-75lbs but has been unable to lose more. Has done 20-hour fasts and eaten one meal daily.  She wants to stay well nourished and avoid relapse into eating disorder. Goal weight is about 150lbs.   Works 3rd shift on weekends; has been used to eating in evenings/ nights from former and present 3rd shift work/ schedule.  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 She reports intolerance to pork other than bacon.   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 Dining out frequency: 3 meals per week.  Breakfast: coffee with almond milk and sugar free sweetener/  flavoring -- no food; Fridays fast food biscuit Snack: none Lunch: time varies -- sometimes skips -- usually low-carb wrap with pepperoni and cheese, onion, jalapeno, homemade ranch (sour cream and seasoning) Snack: none Supper: usually 7pm -- tacos, bbq chicken avoids eating mac and cheese, some potato; eating one meal of eggs, carrots, cucumber, avocado + chicken or cheese and pepp wrap -- followed intermittently and weight loss was erratic but overall trending down Snack: 9pm -- popcorn if hungry; sugar free dessert occasionally; regular cookie rarely Beverages: water, pepsi zero, coffee in am  Nutrition Care Education: Topics covered:  Basic nutrition: basic food groups, appropriate nutrient balance, appropriate meal and snack schedule, general nutrition guidelines    Weight control: importance of low sugar and low fat choices, appropriate food portions; estimated energy needs at for weight loss at minimum of 1200-1400kcal per patient request, provided guidance for 40-45% CHO, 25-30% protein, and 30% fat -- encouraged avoidance of wide fluctuations in intake; discussed eating at regular intervals and limiting long fasting periods  Nutritional Diagnosis:  [place]-3.3 Overweight/obesity As related to history of eating disorder and weight fluctuation.  As evidenced by patient with current BMI of 37.48. [id number] Predicted suboptimal nutrient intake As related to skipped meals and some times of long fasts.  As evidenced by patient report of eating behaviors.  Intervention:  . Instruction and discussion as noted above. . Patient wants to lose weight and feels she needs to regulate intake strictly to achieve weight loss.  . Established goal of working towards more structured and regular eating pattern, and ensuring adequate daily  intake.  . Will progress to decreased focus on diet and calories in future visits.   Education Materials given:  . Plate Planner with food lists, sample meal pattern . Food  Guide for [employer] . Keys to Successful weight Management . Sample menus . Goals/ instructions   Learner/ who was taught:  . Patient   Level of understanding: Marland Kitchen Verbalizes/ demonstrates competency   Demonstrated degree of understanding via:   Teach back Learning barriers: . None  Willingness to learn/ readiness for change: . Eager, change in progress   Monitoring and Evaluation:  Dietary intake, exercise, and body weight      follow up: 02/23/20 at 10:00am

## 2020-01-21 NOTE — Patient Instructions (Addendum)
   Eat a meal or snack before or during most active time of the day to help metabolize calories.   Eat a meal or snack every 3-5 hours during the day.   Continue to track all foods and beverages in food diary. No specific calorie goal, just to keep aware of what, when, and how much is eaten. Bring in food diary

## 2020-02-23 ENCOUNTER — Ambulatory Visit: Payer: PRIVATE HEALTH INSURANCE | Admitting: Dietician

## 2020-04-05 ENCOUNTER — Encounter: Payer: Self-pay | Admitting: Dietician

## 2020-04-05 NOTE — Progress Notes (Signed)
Have not heard back from patient to reschedule her cancelled appointment from 02/23/20. Sent notification to referring provider.

## 2021-05-13 DIAGNOSIS — J329 Chronic sinusitis, unspecified: Secondary | ICD-10-CM | POA: Diagnosis not present

## 2021-05-13 DIAGNOSIS — J40 Bronchitis, not specified as acute or chronic: Secondary | ICD-10-CM | POA: Diagnosis not present

## 2021-05-13 DIAGNOSIS — Z03818 Encounter for observation for suspected exposure to other biological agents ruled out: Secondary | ICD-10-CM | POA: Diagnosis not present

## 2021-05-13 DIAGNOSIS — J029 Acute pharyngitis, unspecified: Secondary | ICD-10-CM | POA: Diagnosis not present

## 2021-08-20 DIAGNOSIS — H109 Unspecified conjunctivitis: Secondary | ICD-10-CM | POA: Diagnosis not present

## 2021-08-20 DIAGNOSIS — J029 Acute pharyngitis, unspecified: Secondary | ICD-10-CM | POA: Diagnosis not present

## 2021-08-20 DIAGNOSIS — J039 Acute tonsillitis, unspecified: Secondary | ICD-10-CM | POA: Diagnosis not present

## 2021-08-24 DIAGNOSIS — Z8669 Personal history of other diseases of the nervous system and sense organs: Secondary | ICD-10-CM | POA: Diagnosis not present

## 2021-08-24 DIAGNOSIS — R5383 Other fatigue: Secondary | ICD-10-CM | POA: Diagnosis not present

## 2021-08-24 DIAGNOSIS — E8881 Metabolic syndrome: Secondary | ICD-10-CM | POA: Diagnosis not present

## 2021-08-24 DIAGNOSIS — J039 Acute tonsillitis, unspecified: Secondary | ICD-10-CM | POA: Diagnosis not present

## 2021-08-24 DIAGNOSIS — E538 Deficiency of other specified B group vitamins: Secondary | ICD-10-CM | POA: Diagnosis not present

## 2021-08-24 DIAGNOSIS — Z6841 Body Mass Index (BMI) 40.0 and over, adult: Secondary | ICD-10-CM | POA: Diagnosis not present

## 2021-08-24 DIAGNOSIS — R69 Illness, unspecified: Secondary | ICD-10-CM | POA: Diagnosis not present

## 2021-09-28 DIAGNOSIS — R202 Paresthesia of skin: Secondary | ICD-10-CM | POA: Diagnosis not present

## 2021-09-28 DIAGNOSIS — E119 Type 2 diabetes mellitus without complications: Secondary | ICD-10-CM | POA: Diagnosis not present

## 2022-01-26 ENCOUNTER — Other Ambulatory Visit
Admission: RE | Admit: 2022-01-26 | Discharge: 2022-01-26 | Disposition: A | Discharge status: Home / Self Care | Payer: 59 | Source: Ambulatory Visit | Attending: Student | Admitting: Student

## 2022-01-26 ENCOUNTER — Other Ambulatory Visit: Payer: Self-pay | Admitting: Student

## 2022-01-26 DIAGNOSIS — M545 Low back pain, unspecified: Secondary | ICD-10-CM | POA: Insufficient documentation

## 2022-01-26 DIAGNOSIS — R0602 Shortness of breath: Secondary | ICD-10-CM | POA: Diagnosis present

## 2022-01-26 DIAGNOSIS — R1013 Epigastric pain: Secondary | ICD-10-CM

## 2022-01-26 DIAGNOSIS — R1011 Right upper quadrant pain: Secondary | ICD-10-CM

## 2022-01-26 LAB — D-DIMER, QUANTITATIVE: D-Dimer, Quant: <0.27 ug/mL-FEU (ref 0.00–0.50)

## 2022-01-27 ENCOUNTER — Ambulatory Visit
Admission: RE | Admit: 2022-01-27 | Discharge: 2022-01-27 | Disposition: A | Discharge status: Home / Self Care | Payer: 59 | Source: Ambulatory Visit | Attending: Student | Admitting: Student

## 2022-01-27 DIAGNOSIS — R1011 Right upper quadrant pain: Secondary | ICD-10-CM

## 2022-01-27 DIAGNOSIS — R1013 Epigastric pain: Secondary | ICD-10-CM | POA: Diagnosis present

## 2022-01-31 ENCOUNTER — Ambulatory Visit: Payer: Self-pay | Admitting: General Surgery

## 2022-01-31 NOTE — H&P (View-Only) (Signed)
PATIENT PROFILE: Jamie Weber is a 36 y.o. female who presents to the Clinic for consultation at the request of Dr. Bjorn Loser for evaluation of cholelithiasis.  PCP:  Gladstone Lighter, MD  HISTORY OF PRESENT ILLNESS: Ms. Whistler reports she has been having abdominal pain for many years.  She endorses that the last 2 weeks she has been having severe right upper quadrant pain that radiates to her back.  It is aggravated by eating.  She denies any elevating factors.  Pain described as sharp.  Endorses some associated nausea and vomiting.  Denies any fever.  She had an ultrasound that shows cholelithiasis.  In the past she had HIDA scan which shows 91% ejection fraction.  I personally evaluated the images of the abdominal ultrasound and the HIDA scan.   PROBLEM LIST: Problem List  Date Reviewed: 01/26/2022          Noted   Intractable migraine without aura and without status migrainosus 08/05/2019   Allergy 10/25/2015   Overview    Overview:  history of hives controoled with zyrtec      History of migraine headaches 10/25/2015   Overview    Last Assessment & Plan:  Patient was taking 2 tablets of Elavil at nighttime and it did help with the depression. Patient has had a headache for 4 days with the last 2 days being constant. Associated nausea and photophobia are present. Patient states it is on the left side of her head. Patient has not taken anything other than Excedrin Migraine, which was not helpful. I will call in for Imitrex 25 mg patient is aware of instructions as she has taken this prior.       Generalized anxiety disorder 08/07/2012   Overview    Last Assessment & Plan:  Stable with intermittent Xanax. Patient will go up to Elavil 50 mg at nighttime. Patient states this is very helpful. Denies suicidal or homicidal ideations.      Sciatica of left side 03/03/2011   Hormonal contraceptive 01/03/2011   Overview    Overview:  Implanon placed Oct 16 by Dr. Kenton Kingfisher,  Left arm ,  Good till  2013.    Last Assessment & Plan:  Managed currently with Dep provera IM injections every 4 months       Obesity (BMI 30-39.9), unspecified 01/03/2011   Overview    Overview:  Body mass index is 32.62 kg/(m^2).   Last Assessment & Plan:  I have addressed  BMI and recommended wt loss of 10% of body weight over the next 6 months . Phentermine use not appropriate until she is able to adhere to a comprehensive program  using a low glycemic index diet and regular exercise a minimum of 5 days per week        GENERAL REVIEW OF SYSTEMS:   General ROS: negative for - chills, fatigue, fever, weight gain or weight loss Allergy and Immunology ROS: negative for - hives  Hematological and Lymphatic ROS: negative for - bleeding problems or bruising, negative for palpable nodes Endocrine ROS: negative for - heat or cold intolerance, hair changes Respiratory ROS: negative for - cough, shortness of breath or wheezing Cardiovascular ROS: no chest pain or palpitations GI ROS: Positive for nausea, vomiting, abdominal pain Musculoskeletal ROS: negative for - joint swelling or muscle pain Neurological ROS: negative for - confusion, syncope Dermatological ROS: negative for pruritus and rash Psychiatric: negative for anxiety, depression, difficulty sleeping and memory loss  MEDICATIONS: Current Outpatient Medications  Medication Sig Dispense  Refill   hydrOXYzine HCL (ATARAX) 10 MG tablet Take 1 tablet (10 mg total) by mouth 2 (two) times daily as needed for Anxiety for up to 60 days 60 tablet 1   magnesium oxide (MAG-OX) 400 mg (241.3 mg magnesium) tablet Take 400 mg by mouth once daily     promethazine (PHENERGAN) 12.5 MG tablet Take 1 tablet (12.5 mg total) by mouth every 8 (eight) hours as needed for Nausea 20 tablet 0   SUMAtriptan (IMITREX) 50 MG tablet Take 1 tablet (50 mg total) by mouth as directed for Migraine May take a second dose after 2 hours if needed. 9 tablet 5   ascorbic acid, vitamin C,  (VITAMIN C) 1000 MG tablet Take 1,000 mg by mouth once daily (Patient not taking: Reported on 01/31/2022)     baclofen (LIORESAL) 5 mg tablet Take 1 tablet (5 mg total) by mouth 2 (two) times daily as needed (muscle spasm) for up to 7 days (Patient not taking: Reported on 01/31/2022) 14 tablet 0   blood glucose diagnostic test strip 1 each (1 strip total) once daily Use as instructed. (Patient not taking: Reported on 01/26/2022) 30 each 12   blood glucose meter kit as directed (Patient not taking: Reported on 01/26/2022) 1 each 0   busPIRone (BUSPAR) 5 MG tablet Take 1 tablet (5 mg total) by mouth 2 (two) times daily (Patient not taking: Reported on 01/26/2022) 60 tablet 0   cholecalciferol (VITAMIN D3) 1000 unit tablet Take 2,000 Units by mouth once daily (Patient not taking: Reported on 01/31/2022)     ciprofloxacin HCl (CIPRO) 500 MG tablet Take 1 tablet (500 mg total) by mouth 2 (two) times daily for 7 days 14 tablet 0   cyanocobalamin (VITAMIN B12) 1000 MCG tablet Take 1,000 mcg by mouth once daily (Patient not taking: Reported on 01/31/2022)     galcanezumab-gnlm (EMGALITY SYRINGE) 300 mg/3 mL (100 mg/mL x 3) Syrg Inject 300 mg subcutaneously every 28 (twenty-eight) days 1 mL 5   liraglutide, weight loss, (SAXENDA) 3 mg/0.5 mL (18 mg/3 mL) pen injector Inject 0.6 mg subcutaneously once daily for 7 days, THEN 1.2 mg once daily for 7 days, THEN 1.8 mg once daily for 7 days, THEN 2.4 mg once daily for 7 days. 15 mL 0   omeprazole (PRILOSEC) 20 MG DR capsule Take 1 capsule (20 mg total) by mouth once daily for 14 days 14 capsule 0   topiramate (TOPAMAX) 50 MG tablet TAKE 1 TABLET BY MOUTH TWICE A DAY (Patient not taking: Reported on 01/31/2022) 30 tablet 1   No current facility-administered medications for this visit.    ALLERGIES: Mushroom and Other  PAST MEDICAL HISTORY: Past Medical History:  Diagnosis Date   Abnormal bleeding in menstrual cycle    Anxiety    Depression    Fibromyalgia     Migraine headache    cluster   Migraines    Restless leg syndrome     PAST SURGICAL HISTORY: Past Surgical History:  Procedure Laterality Date   ACHILLES TENDON REPAIR     CESAREAN SECTION     DILATION AND CURETTAGE, DIAGNOSTIC / THERAPEUTIC     ENDOSCOPIC ULTRASOUND     foot surgery tendon release     HYSTERECTOMY       FAMILY HISTORY: Family History  Problem Relation Age of Onset   No Known Problems Mother    No Known Problems Father    Migraines Daughter    Cancer Maternal Grandmother  Alzheimer's disease Paternal Grandmother      SOCIAL HISTORY: Social History   Socioeconomic History   Marital status: Married  Tobacco Use   Smoking status: Former    Packs/day: .25    Types: Cigarettes   Smokeless tobacco: Never  Vaping Use   Vaping Use: Never used  Substance and Sexual Activity   Alcohol use: Yes    Comment: social   Drug use: Never   Sexual activity: Not Currently    PHYSICAL EXAM: Vitals:   01/31/22 1115  BP: 125/76  Pulse: 76   Body mass index is 40.77 kg/m. Weight: (!) 111.1 kg (245 lb)   GENERAL: Alert, active, oriented x3  HEENT: Pupils equal reactive to light. Extraocular movements are intact. Sclera clear. Palpebral conjunctiva normal red color.Pharynx clear.  NECK: Supple with no palpable mass and no adenopathy.  LUNGS: Sound clear with no rales rhonchi or wheezes.  HEART: Regular rhythm S1 and S2 without murmur.  ABDOMEN: Soft and depressible, nontender with no palpable mass, no hepatomegaly.   EXTREMITIES: Well-developed well-nourished symmetrical with no dependent edema.  NEUROLOGICAL: Awake alert oriented, facial expression symmetrical, moving all extremities.  REVIEW OF DATA: I have reviewed the following data today: Office Visit on 01/26/2022  Component Date Value   Color 01/26/2022 Light Yellow    Clarity 01/26/2022 Clear    Specific Gravity 01/26/2022 1.009    pH, Urine 01/26/2022 6.5    Protein, Urinalysis  01/26/2022 Negative    Glucose, Urinalysis 01/26/2022 Negative    Ketones, Urinalysis 01/26/2022 Negative    Blood, Urinalysis 01/26/2022 Negative    Nitrite, Urinalysis 01/26/2022 Negative    Leukocyte Esterase, Urin* 01/26/2022 Negative    Bilirubin, Urinalysis 01/26/2022 Negative    Urobilinogen, Urinalysis 01/26/2022 0.2    WBC, UA 01/26/2022 0    Red Blood Cells, Urinaly* 01/26/2022 0    Bacteria, Urinalysis 01/26/2022 0-5    Squamous Epithelial Cell* 01/26/2022 0    WBC (White Blood Cell Co* 01/26/2022 7.7    RBC (Red Blood Cell Coun* 01/26/2022 4.94    Hemoglobin 01/26/2022 14.4    Hematocrit 01/26/2022 44.3    MCV (Mean Corpuscular Vo* 01/26/2022 89.7    MCH (Mean Corpuscular He* 01/26/2022 29.1    MCHC (Mean Corpuscular H* 01/26/2022 32.5    Platelet Count 01/26/2022 315    RDW-CV (Red Cell Distrib* 01/26/2022 12.4    MPV (Mean Platelet Volum* 01/26/2022 10.2    Neutrophils 01/26/2022 3.86    Lymphocytes 01/26/2022 2.95    Monocytes 01/26/2022 0.60    Eosinophils 01/26/2022 0.17    Basophils 01/26/2022 0.07    Neutrophil % 01/26/2022 50.3    Lymphocyte % 01/26/2022 38.5    Monocyte % 01/26/2022 7.8    Eosinophil % 01/26/2022 2.2    Basophil% 01/26/2022 0.9    Immature Granulocyte % 01/26/2022 0.3    Immature Granulocyte Cou* 01/26/2022 0.02    Glucose 01/26/2022 103    Sodium 01/26/2022 137    Potassium 01/26/2022 4.0    Chloride 01/26/2022 103    Carbon Dioxide (CO2) 01/26/2022 29.9    Urea Nitrogen (BUN) 01/26/2022 8    Creatinine 01/26/2022 0.7    Glomerular Filtration Ra* 01/26/2022 116    Calcium 01/26/2022 9.2    AST  01/26/2022 18    ALT  01/26/2022 28    Alk Phos (alkaline Phosp* 01/26/2022 47    Albumin 01/26/2022 4.6    Bilirubin, Total 01/26/2022 0.6    Protein, Total 01/26/2022 7.5  A/G Ratio 01/26/2022 1.6    Lipase 01/26/2022 14    Vent Rate (bpm) 01/26/2022 65    PR Interval (msec) 01/26/2022 134    QRS Interval (msec) 01/26/2022 88     QT Interval (msec) 01/26/2022 408    QTc (msec) 01/26/2022 424    H pylori Ag, Stl - [facility]* 01/27/2022 Negative      ASSESSMENT: Ms. Heathcock is a 36 y.o. female presenting for consultation for cholelithiasis.    Patient was oriented about the diagnosis of cholelithiasis. Also oriented about what is the gallbladder, its anatomy and function and the implications of having stones. The patient was oriented about the treatment alternatives (observation vs cholecystectomy). Patient was oriented that a low percentage of patient will continue to have similar pain symptoms even after the gallbladder is removed. Surgical technique (open vs laparoscopic) was discussed. It was also discussed the goals of the surgery (decrease the pain episodes and avoid the risk of cholecystitis) and the risk of surgery including: bleeding, infection, common bile duct injury, stone retention, injury to other organs such as bowel, liver, stomach, other complications such as hernia, bowel obstruction among others. Also discussed with patient about anesthesia and its complications such as: reaction to medications, pneumonia, heart complications, death, among others.   Cholelithiasis without cholecystitis [K80.20]  PLAN: 1.  Robotic assisted laparoscopic cholecystectomy (34742) 2.  CBC, CMP done 3.  Do not take aspirin 5 days before the procedure 5.  Contact us if has any question or concern.   Patient verbalized understanding, all questions were answered, and were agreeable with the plan outlined above.     Herbert Pun, MD  Electronically signed by Herbert Pun, MD

## 2022-01-31 NOTE — H&P (Signed)
PATIENT PROFILE: Jamie Weber is a 36 y.o. female who presents to the Clinic for consultation at the request of Dr. Bjorn Weber for evaluation of cholelithiasis.  PCP:  Jamie Lighter, MD  HISTORY OF PRESENT ILLNESS: Ms. Jamie Weber reports she has been having abdominal pain for many years.  She endorses that the last 2 weeks she has been having severe right upper quadrant pain that radiates to her back.  It is aggravated by eating.  She denies any elevating factors.  Pain described as sharp.  Endorses some associated nausea and vomiting.  Denies any fever.  She had an ultrasound that shows cholelithiasis.  In the past she had HIDA scan which shows 91% ejection fraction.  I personally evaluated the images of the abdominal ultrasound and the HIDA scan.   PROBLEM LIST: Problem List  Date Reviewed: 01/26/2022          Noted   Intractable migraine without aura and without status migrainosus 08/05/2019   Allergy 10/25/2015   Overview    Overview:  history of hives controoled with zyrtec      History of migraine headaches 10/25/2015   Overview    Last Assessment & Plan:  Patient was taking 2 tablets of Elavil at nighttime and it did help with the depression. Patient has had a headache for 4 days with the last 2 days being constant. Associated nausea and photophobia are present. Patient states it is on the left side of her head. Patient has not taken anything other than Excedrin Migraine, which was not helpful. I will call in for Imitrex 25 mg patient is aware of instructions as she has taken this prior.       Generalized anxiety disorder 08/07/2012   Overview    Last Assessment & Plan:  Stable with intermittent Xanax. Patient will go up to Elavil 50 mg at nighttime. Patient states this is very helpful. Denies suicidal or homicidal ideations.      Sciatica of left side 03/03/2011   Hormonal contraceptive 01/03/2011   Overview    Overview:  Implanon placed Oct 16 by Dr. Kenton Weber,  Left arm ,  Good till  2013.    Last Assessment & Plan:  Managed currently with Dep provera IM injections every 4 months       Obesity (BMI 30-39.9), unspecified 01/03/2011   Overview    Overview:  Body mass index is 32.62 kg/(m^2).   Last Assessment & Plan:  I have addressed  BMI and recommended wt loss of 10% of body weight over the next 6 months . Phentermine use not appropriate until she is able to adhere to a comprehensive program  using a low glycemic index diet and regular exercise a minimum of 5 days per week        GENERAL REVIEW OF SYSTEMS:   General ROS: negative for - chills, fatigue, fever, weight gain or weight loss Allergy and Immunology ROS: negative for - hives  Hematological and Lymphatic ROS: negative for - bleeding problems or bruising, negative for palpable nodes Endocrine ROS: negative for - heat or cold intolerance, hair changes Respiratory ROS: negative for - cough, shortness of breath or wheezing Cardiovascular ROS: no chest pain or palpitations GI ROS: Positive for nausea, vomiting, abdominal pain Musculoskeletal ROS: negative for - joint swelling or muscle pain Neurological ROS: negative for - confusion, syncope Dermatological ROS: negative for pruritus and rash Psychiatric: negative for anxiety, depression, difficulty sleeping and memory loss  MEDICATIONS: Current Outpatient Medications  Medication Sig Dispense  Refill   hydrOXYzine HCL (ATARAX) 10 MG tablet Take 1 tablet (10 mg total) by mouth 2 (two) times daily as needed for Anxiety for up to 60 days 60 tablet 1   magnesium oxide (MAG-OX) 400 mg (241.3 mg magnesium) tablet Take 400 mg by mouth once daily     promethazine (PHENERGAN) 12.5 MG tablet Take 1 tablet (12.5 mg total) by mouth every 8 (eight) hours as needed for Nausea 20 tablet 0   SUMAtriptan (IMITREX) 50 MG tablet Take 1 tablet (50 mg total) by mouth as directed for Migraine May take a second dose after 2 hours if needed. 9 tablet 5   ascorbic acid, vitamin C,  (VITAMIN C) 1000 MG tablet Take 1,000 mg by mouth once daily (Patient not taking: Reported on 01/31/2022)     baclofen (LIORESAL) 5 mg tablet Take 1 tablet (5 mg total) by mouth 2 (two) times daily as needed (muscle spasm) for up to 7 days (Patient not taking: Reported on 01/31/2022) 14 tablet 0   blood glucose diagnostic test strip 1 each (1 strip total) once daily Use as instructed. (Patient not taking: Reported on 01/26/2022) 30 each 12   blood glucose meter kit as directed (Patient not taking: Reported on 01/26/2022) 1 each 0   busPIRone (BUSPAR) 5 MG tablet Take 1 tablet (5 mg total) by mouth 2 (two) times daily (Patient not taking: Reported on 01/26/2022) 60 tablet 0   cholecalciferol (VITAMIN D3) 1000 unit tablet Take 2,000 Units by mouth once daily (Patient not taking: Reported on 01/31/2022)     ciprofloxacin HCl (CIPRO) 500 MG tablet Take 1 tablet (500 mg total) by mouth 2 (two) times daily for 7 days 14 tablet 0   cyanocobalamin (VITAMIN B12) 1000 MCG tablet Take 1,000 mcg by mouth once daily (Patient not taking: Reported on 01/31/2022)     galcanezumab-gnlm (EMGALITY SYRINGE) 300 mg/3 mL (100 mg/mL x 3) Syrg Inject 300 mg subcutaneously every 28 (twenty-eight) days 1 mL 5   liraglutide, weight loss, (SAXENDA) 3 mg/0.5 mL (18 mg/3 mL) pen injector Inject 0.6 mg subcutaneously once daily for 7 days, THEN 1.2 mg once daily for 7 days, THEN 1.8 mg once daily for 7 days, THEN 2.4 mg once daily for 7 days. 15 mL 0   omeprazole (PRILOSEC) 20 MG DR capsule Take 1 capsule (20 mg total) by mouth once daily for 14 days 14 capsule 0   topiramate (TOPAMAX) 50 MG tablet TAKE 1 TABLET BY MOUTH TWICE A DAY (Patient not taking: Reported on 01/31/2022) 30 tablet 1   No current facility-administered medications for this visit.    ALLERGIES: Mushroom and Other  PAST MEDICAL HISTORY: Past Medical History:  Diagnosis Date   Abnormal bleeding in menstrual cycle    Anxiety    Depression    Fibromyalgia     Migraine headache    cluster   Migraines    Restless leg syndrome     PAST SURGICAL HISTORY: Past Surgical History:  Procedure Laterality Date   ACHILLES TENDON REPAIR     CESAREAN SECTION     DILATION AND CURETTAGE, DIAGNOSTIC / THERAPEUTIC     ENDOSCOPIC ULTRASOUND     foot surgery tendon release     HYSTERECTOMY       FAMILY HISTORY: Family History  Problem Relation Age of Onset   No Known Problems Mother    No Known Problems Father    Migraines Daughter    Cancer Maternal Grandmother  Alzheimer's disease Paternal Grandmother      SOCIAL HISTORY: Social History   Socioeconomic History   Marital status: Married  Tobacco Use   Smoking status: Former    Packs/day: .25    Types: Cigarettes   Smokeless tobacco: Never  Vaping Use   Vaping Use: Never used  Substance and Sexual Activity   Alcohol use: Yes    Comment: social   Drug use: Never   Sexual activity: Not Currently    PHYSICAL EXAM: Vitals:   01/31/22 1115  BP: 125/76  Pulse: 76   Body mass index is 40.77 kg/m. Weight: (!) 111.1 kg (245 lb)   GENERAL: Alert, active, oriented x3  HEENT: Pupils equal reactive to light. Extraocular movements are intact. Sclera clear. Palpebral conjunctiva normal red color.Pharynx clear.  NECK: Supple with no palpable mass and no adenopathy.  LUNGS: Sound clear with no rales rhonchi or wheezes.  HEART: Regular rhythm S1 and S2 without murmur.  ABDOMEN: Soft and depressible, nontender with no palpable mass, no hepatomegaly.   EXTREMITIES: Well-developed well-nourished symmetrical with no dependent edema.  NEUROLOGICAL: Awake alert oriented, facial expression symmetrical, moving all extremities.  REVIEW OF DATA: I have reviewed the following data today: Office Visit on 01/26/2022  Component Date Value   Color 01/26/2022 Light Yellow    Clarity 01/26/2022 Clear    Specific Gravity 01/26/2022 1.009    pH, Urine 01/26/2022 6.5    Protein, Urinalysis  01/26/2022 Negative    Glucose, Urinalysis 01/26/2022 Negative    Ketones, Urinalysis 01/26/2022 Negative    Blood, Urinalysis 01/26/2022 Negative    Nitrite, Urinalysis 01/26/2022 Negative    Leukocyte Esterase, Urin* 01/26/2022 Negative    Bilirubin, Urinalysis 01/26/2022 Negative    Urobilinogen, Urinalysis 01/26/2022 0.2    WBC, UA 01/26/2022 0    Red Blood Cells, Urinaly* 01/26/2022 0    Bacteria, Urinalysis 01/26/2022 0-5    Squamous Epithelial Cell* 01/26/2022 0    WBC (White Blood Cell Co* 01/26/2022 7.7    RBC (Red Blood Cell Coun* 01/26/2022 4.94    Hemoglobin 01/26/2022 14.4    Hematocrit 01/26/2022 44.3    MCV (Mean Corpuscular Vo* 01/26/2022 89.7    MCH (Mean Corpuscular He* 01/26/2022 29.1    MCHC (Mean Corpuscular H* 01/26/2022 32.5    Platelet Count 01/26/2022 315    RDW-CV (Red Cell Distrib* 01/26/2022 12.4    MPV (Mean Platelet Volum* 01/26/2022 10.2    Neutrophils 01/26/2022 3.86    Lymphocytes 01/26/2022 2.95    Monocytes 01/26/2022 0.60    Eosinophils 01/26/2022 0.17    Basophils 01/26/2022 0.07    Neutrophil % 01/26/2022 50.3    Lymphocyte % 01/26/2022 38.5    Monocyte % 01/26/2022 7.8    Eosinophil % 01/26/2022 2.2    Basophil% 01/26/2022 0.9    Immature Granulocyte % 01/26/2022 0.3    Immature Granulocyte Cou* 01/26/2022 0.02    Glucose 01/26/2022 103    Sodium 01/26/2022 137    Potassium 01/26/2022 4.0    Chloride 01/26/2022 103    Carbon Dioxide (CO2) 01/26/2022 29.9    Urea Nitrogen (BUN) 01/26/2022 8    Creatinine 01/26/2022 0.7    Glomerular Filtration Ra* 01/26/2022 116    Calcium 01/26/2022 9.2    AST  01/26/2022 18    ALT  01/26/2022 28    Alk Phos (alkaline Phosp* 01/26/2022 47    Albumin 01/26/2022 4.6    Bilirubin, Total 01/26/2022 0.6    Protein, Total 01/26/2022 7.5  A/G Ratio 01/26/2022 1.6    Lipase 01/26/2022 14    Vent Rate (bpm) 01/26/2022 65    PR Interval (msec) 01/26/2022 134    QRS Interval (msec) 01/26/2022 88     QT Interval (msec) 01/26/2022 408    QTc (msec) 01/26/2022 424    H pylori Ag, Stl - [facility]* 01/27/2022 Negative      ASSESSMENT: Ms. Name is a 36 y.o. female presenting for consultation for cholelithiasis.    Patient was oriented about the diagnosis of cholelithiasis. Also oriented about what is the gallbladder, its anatomy and function and the implications of having stones. The patient was oriented about the treatment alternatives (observation vs cholecystectomy). Patient was oriented that a low percentage of patient will continue to have similar pain symptoms even after the gallbladder is removed. Surgical technique (open vs laparoscopic) was discussed. It was also discussed the goals of the surgery (decrease the pain episodes and avoid the risk of cholecystitis) and the risk of surgery including: bleeding, infection, common bile duct injury, stone retention, injury to other organs such as bowel, liver, stomach, other complications such as hernia, bowel obstruction among others. Also discussed with patient about anesthesia and its complications such as: reaction to medications, pneumonia, heart complications, death, among others.   Cholelithiasis without cholecystitis [K80.20]  PLAN: 1.  Robotic assisted laparoscopic cholecystectomy (09811) 2.  CBC, CMP done 3.  Do not take aspirin 5 days before the procedure 5.  Contact us if has any question or concern.   Patient verbalized understanding, all questions were answered, and were agreeable with the plan outlined above.     Herbert Pun, MD  Electronically signed by Herbert Pun, MD

## 2022-02-06 ENCOUNTER — Encounter
Admission: RE | Admit: 2022-02-06 | Discharge: 2022-02-06 | Disposition: A | Discharge status: Home / Self Care | Payer: 59 | Source: Ambulatory Visit | Attending: General Surgery | Admitting: General Surgery

## 2022-02-06 ENCOUNTER — Other Ambulatory Visit: Payer: Self-pay

## 2022-02-06 NOTE — Patient Instructions (Addendum)
Your procedure is scheduled on: 02/13/22 - Monday Report to the Registration Desk on the 1st floor of the [facility]. To find out your arrival time, please call [phone] between 1PM - 3PM on: 02/10/22 - Friday If your arrival time is 6:00 am, do not arrive prior to that time as the [facility] entrance doors do not open until 6:00 am.  REMEMBER: Instructions that are not followed completely may result in serious medical risk, up to and including death; or upon the discretion of your surgeon and anesthesiologist your surgery may need to be rescheduled.  Do not eat food or drink any liquids after midnight the night before surgery.  No gum chewing, lozengers or hard candies.   TAKE THESE MEDICATIONS THE MORNING OF SURGERY WITH A SIP OF WATER:  - hydrOXYzine (ATARAX)   One week prior to surgery: Stop Anti-inflammatories (NSAIDS) such as Advil, Aleve, Ibuprofen, Motrin, Naproxen, Naprosyn and Aspirin based products such as Excedrin, Goodys Powder, BC Powder.  Stop ANY OVER THE COUNTER supplements until after surgery.  You may however, continue to take Tylenol if needed for pain up until the day of surgery.  No Alcohol for 24 hours before or after surgery.  No Smoking including e-cigarettes for 24 hours prior to surgery.  No chewable tobacco products for at least 6 hours prior to surgery.  No nicotine patches on the day of surgery.  Do not use any "recreational" drugs for at least a week prior to your surgery.  Please be advised that the combination of cocaine and anesthesia may have negative outcomes, up to and including death. If you test positive for cocaine, your surgery will be cancelled.  On the morning of surgery brush your teeth with toothpaste and water, you may rinse your mouth with mouthwash if you wish. Do not swallow any toothpaste or mouthwash.  Use CHG Soap or wipes as directed on instruction sheet.  Do not wear jewelry, make-up, hairpins, clips or nail  polish.  Do not wear lotions, powders, or perfumes.   Do not shave body from the neck down 48 hours prior to surgery just in case you cut yourself which could leave a site for infection.  Also, freshly shaved skin may become irritated if using the CHG soap.  Contact lenses, hearing aids and dentures may not be worn into surgery.  Do not bring valuables to the hospital. [facility] is not responsible for any missing/lost belongings or valuables.   Notify your doctor if there is any change in your medical condition (cold, fever, infection).  Wear comfortable clothing (specific to your surgery type) to the hospital.  After surgery, you can help prevent lung complications by doing breathing exercises.  Take deep breaths and cough every 1-2 hours. Your doctor may order a device called an Incentive Spirometer to help you take deep breaths. When coughing or sneezing, hold a pillow firmly against your incision with both hands. This is called "splinting." Doing this helps protect your incision. It also decreases belly discomfort.  If you are being admitted to the hospital overnight, leave your suitcase in the car. After surgery it may be brought to your room.  If you are being discharged the day of surgery, you will not be allowed to drive home. You will need a responsible adult (18 years or older) to drive you home and stay with you that night.   If you are taking public transportation, you will need to have a responsible adult (18 years or  older) with you. Please confirm with your physician that it is acceptable to use public transportation.   Please call the [facility] at [phone] if you have any questions about these instructions.  Surgery Visitation Policy:  Patients undergoing a surgery or procedure may have two family members or support persons with them as long as the person is not COVID-19 positive or experiencing its symptoms.   Inpatient Visitation:     Visiting hours are 7 a.m. to 8 p.m. Up to four visitors are allowed at one time in a patient room, including children. The visitors may rotate out with other people during the day. One designated support person (adult) may remain overnight.

## 2022-02-12 MED ORDER — LACTATED RINGERS IV SOLN: INTRAVENOUS | Status: DC

## 2022-02-12 MED ORDER — INDOCYANINE GREEN 25 MG IV SOLR
1.2500 mg | Freq: Once | INTRAVENOUS | Status: AC
  Administered 2022-02-13: 1.25 mg via INTRAVENOUS
  Filled 2022-02-12: qty 0.5

## 2022-02-12 MED ORDER — FAMOTIDINE 20 MG PO TABS
20.0000 mg | ORAL_TABLET | Freq: Once | ORAL | Status: AC
  Administered 2022-02-13: 20 mg via ORAL

## 2022-02-12 MED ORDER — ORAL CARE MOUTH RINSE
15.0000 mL | Freq: Once | OROMUCOSAL | Status: AC
  Administered 2022-02-13: 15 mL via OROMUCOSAL

## 2022-02-12 MED ORDER — CEFAZOLIN SODIUM-DEXTROSE 2-4 GM/100ML-% IV SOLN
2.0000 g | INTRAVENOUS | Status: AC
  Administered 2022-02-13: 2 g via INTRAVENOUS

## 2022-02-12 MED ORDER — CHLORHEXIDINE GLUCONATE 0.12 % MT SOLN: 15.0000 mL | Freq: Once | OROMUCOSAL | Status: AC

## 2022-02-13 ENCOUNTER — Encounter: Payer: Self-pay | Admitting: General Surgery

## 2022-02-13 ENCOUNTER — Other Ambulatory Visit: Payer: Self-pay

## 2022-02-13 ENCOUNTER — Ambulatory Visit: Payer: 59 | Admitting: Urgent Care

## 2022-02-13 ENCOUNTER — Ambulatory Visit: Payer: 59 | Admitting: Anesthesiology

## 2022-02-13 ENCOUNTER — Ambulatory Visit
Admission: RE | Admit: 2022-02-13 | Discharge: 2022-02-13 | Disposition: A | Discharge status: Home / Self Care | Payer: 59 | Source: Ambulatory Visit | Attending: General Surgery | Admitting: General Surgery

## 2022-02-13 ENCOUNTER — Encounter
Admission: RE | Disposition: A | Discharge status: Home / Self Care | Payer: Self-pay | Source: Ambulatory Visit | Attending: General Surgery

## 2022-02-13 DIAGNOSIS — M797 Fibromyalgia: Secondary | ICD-10-CM | POA: Insufficient documentation

## 2022-02-13 DIAGNOSIS — K828 Other specified diseases of gallbladder: Secondary | ICD-10-CM | POA: Insufficient documentation

## 2022-02-13 DIAGNOSIS — G43909 Migraine, unspecified, not intractable, without status migrainosus: Secondary | ICD-10-CM | POA: Insufficient documentation

## 2022-02-13 DIAGNOSIS — E669 Obesity, unspecified: Secondary | ICD-10-CM | POA: Insufficient documentation

## 2022-02-13 DIAGNOSIS — K219 Gastro-esophageal reflux disease without esophagitis: Secondary | ICD-10-CM | POA: Insufficient documentation

## 2022-02-13 DIAGNOSIS — F32A Depression, unspecified: Secondary | ICD-10-CM | POA: Diagnosis not present

## 2022-02-13 DIAGNOSIS — Z87891 Personal history of nicotine dependence: Secondary | ICD-10-CM | POA: Insufficient documentation

## 2022-02-13 DIAGNOSIS — F419 Anxiety disorder, unspecified: Secondary | ICD-10-CM | POA: Diagnosis not present

## 2022-02-13 DIAGNOSIS — K8010 Calculus of gallbladder with chronic cholecystitis without obstruction: Secondary | ICD-10-CM | POA: Insufficient documentation

## 2022-02-13 DIAGNOSIS — Z6841 Body Mass Index (BMI) 40.0 and over, adult: Secondary | ICD-10-CM | POA: Insufficient documentation

## 2022-02-13 SURGERY — CHOLECYSTECTOMY, ROBOT-ASSISTED, LAPAROSCOPIC
Anesthesia: General | Site: Abdomen

## 2022-02-13 MED ORDER — KETOROLAC TROMETHAMINE 30 MG/ML IJ SOLN
INTRAMUSCULAR | Status: AC
  Filled 2022-02-13: qty 1

## 2022-02-13 MED ORDER — HYDROMORPHONE HCL 1 MG/ML IJ SOLN
INTRAMUSCULAR | Status: AC
  Administered 2022-02-13: 0.5 mg via INTRAVENOUS
  Filled 2022-02-13: qty 1

## 2022-02-13 MED ORDER — DROPERIDOL 2.5 MG/ML IJ SOLN: 0.6250 mg | Freq: Once | INTRAMUSCULAR | Status: DC | PRN

## 2022-02-13 MED ORDER — DEXAMETHASONE SODIUM PHOSPHATE 10 MG/ML IJ SOLN
INTRAMUSCULAR | Status: DC | PRN
  Administered 2022-02-13: 8 mg via INTRAVENOUS

## 2022-02-13 MED ORDER — EPHEDRINE SULFATE (PRESSORS) 50 MG/ML IJ SOLN
INTRAMUSCULAR | Status: DC | PRN
  Administered 2022-02-13: 5 mg via INTRAVENOUS

## 2022-02-13 MED ORDER — PHENYLEPHRINE 80 MCG/ML (10ML) SYRINGE FOR IV PUSH (FOR BLOOD PRESSURE SUPPORT)
PREFILLED_SYRINGE | INTRAVENOUS | Status: AC
  Filled 2022-02-13: qty 10

## 2022-02-13 MED ORDER — FENTANYL CITRATE (PF) 100 MCG/2ML IJ SOLN
INTRAMUSCULAR | Status: AC
  Filled 2022-02-13: qty 2

## 2022-02-13 MED ORDER — OXYCODONE HCL 5 MG PO TABS
ORAL_TABLET | ORAL | Status: AC
  Administered 2022-02-13: 5 mg via ORAL
  Filled 2022-02-13: qty 1

## 2022-02-13 MED ORDER — OXYCODONE HCL 5 MG PO TABS: 5.0000 mg | ORAL_TABLET | Freq: Once | ORAL | Status: AC | PRN

## 2022-02-13 MED ORDER — HYDROCODONE-ACETAMINOPHEN 5-325 MG PO TABS: 1.0000 | ORAL_TABLET | ORAL | 0 refills | Status: AC | PRN

## 2022-02-13 MED ORDER — LIDOCAINE HCL (CARDIAC) PF 100 MG/5ML IV SOSY
PREFILLED_SYRINGE | INTRAVENOUS | Status: DC | PRN
  Administered 2022-02-13: 80 mg via INTRAVENOUS

## 2022-02-13 MED ORDER — CEFAZOLIN SODIUM-DEXTROSE 2-4 GM/100ML-% IV SOLN
INTRAVENOUS | Status: AC
  Filled 2022-02-13: qty 100

## 2022-02-13 MED ORDER — MIDAZOLAM HCL 2 MG/2ML IJ SOLN
INTRAMUSCULAR | Status: AC
  Filled 2022-02-13: qty 2

## 2022-02-13 MED ORDER — SUGAMMADEX SODIUM 200 MG/2ML IV SOLN
INTRAVENOUS | Status: DC | PRN
  Administered 2022-02-13: 222.2 mg via INTRAVENOUS

## 2022-02-13 MED ORDER — PROMETHAZINE HCL 25 MG/ML IJ SOLN: 6.2500 mg | INTRAMUSCULAR | Status: DC | PRN

## 2022-02-13 MED ORDER — HYDROMORPHONE HCL 1 MG/ML IJ SOLN
0.2500 mg | INTRAMUSCULAR | Status: DC | PRN
  Administered 2022-02-13 (×2): 0.5 mg via INTRAVENOUS

## 2022-02-13 MED ORDER — CHLORHEXIDINE GLUCONATE 0.12 % MT SOLN
OROMUCOSAL | Status: AC
  Filled 2022-02-13: qty 15

## 2022-02-13 MED ORDER — ROCURONIUM BROMIDE 10 MG/ML (PF) SYRINGE
PREFILLED_SYRINGE | INTRAVENOUS | Status: AC
  Filled 2022-02-13: qty 10

## 2022-02-13 MED ORDER — PROPOFOL 10 MG/ML IV BOLUS
INTRAVENOUS | Status: DC | PRN
  Administered 2022-02-13: 200 mg via INTRAVENOUS

## 2022-02-13 MED ORDER — FAMOTIDINE 20 MG PO TABS
ORAL_TABLET | ORAL | Status: AC
  Filled 2022-02-13: qty 1

## 2022-02-13 MED ORDER — ONDANSETRON HCL 4 MG/2ML IJ SOLN
INTRAMUSCULAR | Status: DC | PRN
  Administered 2022-02-13: 4 mg via INTRAVENOUS

## 2022-02-13 MED ORDER — ROCURONIUM BROMIDE 100 MG/10ML IV SOLN
INTRAVENOUS | Status: DC | PRN
  Administered 2022-02-13: 50 mg via INTRAVENOUS

## 2022-02-13 MED ORDER — FENTANYL CITRATE (PF) 100 MCG/2ML IJ SOLN
INTRAMUSCULAR | Status: DC | PRN
  Administered 2022-02-13: 100 ug via INTRAVENOUS

## 2022-02-13 MED ORDER — MIDAZOLAM HCL 2 MG/2ML IJ SOLN
INTRAMUSCULAR | Status: DC | PRN
  Administered 2022-02-13: 2 mg via INTRAVENOUS

## 2022-02-13 MED ORDER — PROMETHAZINE HCL 25 MG/ML IJ SOLN
INTRAMUSCULAR | Status: AC
  Administered 2022-02-13: 12.5 mg via INTRAVENOUS
  Filled 2022-02-13: qty 1

## 2022-02-13 MED ORDER — PHENYLEPHRINE HCL (PRESSORS) 10 MG/ML IV SOLN
INTRAVENOUS | Status: DC | PRN
  Administered 2022-02-13: 80 ug via INTRAVENOUS

## 2022-02-13 MED ORDER — OXYCODONE HCL 5 MG/5ML PO SOLN: 5.0000 mg | Freq: Once | ORAL | Status: AC | PRN

## 2022-02-13 MED ORDER — ONDANSETRON HCL 4 MG/2ML IJ SOLN
INTRAMUSCULAR | Status: AC
  Filled 2022-02-13: qty 2

## 2022-02-13 MED ORDER — PROPOFOL 10 MG/ML IV BOLUS
INTRAVENOUS | Status: AC
  Filled 2022-02-13: qty 40

## 2022-02-13 MED ORDER — BUPIVACAINE-EPINEPHRINE (PF) 0.5% -1:200000 IJ SOLN
INTRAMUSCULAR | Status: AC
  Filled 2022-02-13: qty 30

## 2022-02-13 MED ORDER — BUPIVACAINE-EPINEPHRINE (PF) 0.5% -1:200000 IJ SOLN
INTRAMUSCULAR | Status: DC | PRN
  Administered 2022-02-13: 30 mL via INTRAMUSCULAR

## 2022-02-13 MED ORDER — DEXAMETHASONE SODIUM PHOSPHATE 10 MG/ML IJ SOLN
INTRAMUSCULAR | Status: AC
  Filled 2022-02-13: qty 1

## 2022-02-13 MED ORDER — EPHEDRINE 5 MG/ML INJ
INTRAVENOUS | Status: AC
  Filled 2022-02-13: qty 5

## 2022-02-13 MED ORDER — KETOROLAC TROMETHAMINE 30 MG/ML IJ SOLN
INTRAMUSCULAR | Status: DC | PRN
  Administered 2022-02-13: 30 mg via INTRAVENOUS

## 2022-02-13 SURGICAL SUPPLY — 54 items
ADH SKN CLS APL DERMABOND .7 (GAUZE/BANDAGES/DRESSINGS) ×2
BAG PRESSURE INF REUSE 1000 (BAG) IMPLANT
BLADE SURG SZ11 CARB STEEL (BLADE) ×2 IMPLANT
CANNULA REDUC XI 12-8 STAPL (CANNULA) ×2
CANNULA REDUCER 12-8 DVNC XI (CANNULA) ×2 IMPLANT
CATH REDDICK CHOLANGI 4FR 50CM (CATHETERS) IMPLANT
CLIP LIGATING HEM O LOK PURPLE (MISCELLANEOUS) IMPLANT
CLIP LIGATING HEMO O LOK GREEN (MISCELLANEOUS) ×2 IMPLANT
DERMABOND ADVANCED .7 DNX12 (GAUZE/BANDAGES/DRESSINGS) ×2 IMPLANT
DRAPE ARM DVNC X/XI (DISPOSABLE) ×8 IMPLANT
DRAPE C-ARM XRAY 36X54 (DRAPES) IMPLANT
DRAPE COLUMN DVNC XI (DISPOSABLE) ×2 IMPLANT
DRAPE DA VINCI XI ARM (DISPOSABLE) ×8
DRAPE DA VINCI XI COLUMN (DISPOSABLE) ×2
ELECT REM PT RETURN 9FT ADLT (ELECTROSURGICAL) ×2
ELECTRODE REM PT RTRN 9FT ADLT (ELECTROSURGICAL) ×2 IMPLANT
GLOVE BIO SURGEON STRL SZ 6.5 (GLOVE) ×4 IMPLANT
GLOVE BIOGEL PI IND STRL 6.5 (GLOVE) ×4 IMPLANT
GOWN STRL REUS W/ TWL LRG LVL3 (GOWN DISPOSABLE) ×6 IMPLANT
GOWN STRL REUS W/TWL LRG LVL3 (GOWN DISPOSABLE) ×6
GRASPER SUT TROCAR 14GX15 (MISCELLANEOUS) ×2 IMPLANT
IRRIGATOR SUCT 8 DISP DVNC XI (IRRIGATION / IRRIGATOR) IMPLANT
IRRIGATOR SUCTION 8MM XI DISP (IRRIGATION / IRRIGATOR)
IV CATH ANGIO 12GX3 LT BLUE (NEEDLE) IMPLANT
IV NS 1000ML (IV SOLUTION)
IV NS 1000ML BAXH (IV SOLUTION) IMPLANT
KIT PINK PAD W/HEAD ARE REST (MISCELLANEOUS) ×2
KIT PINK PAD W/HEAD ARM REST (MISCELLANEOUS) ×2 IMPLANT
LABEL OR SOLS (LABEL) ×2 IMPLANT
MANIFOLD NEPTUNE II (INSTRUMENTS) ×2 IMPLANT
NDL INSUFFLATION 14GA 120MM (NEEDLE) ×2 IMPLANT
NEEDLE HYPO 22GX1.5 SAFETY (NEEDLE) ×2 IMPLANT
NEEDLE INSUFFLATION 14GA 120MM (NEEDLE) ×2 IMPLANT
NS IRRIG 500ML POUR BTL (IV SOLUTION) ×2 IMPLANT
OBTURATOR OPTICAL STANDARD 8MM (TROCAR) ×2
OBTURATOR OPTICAL STND 8 DVNC (TROCAR) ×2
OBTURATOR OPTICALSTD 8 DVNC (TROCAR) ×2 IMPLANT
PACK LAP CHOLECYSTECTOMY (MISCELLANEOUS) ×2 IMPLANT
SEAL CANN UNIV 5-8 DVNC XI (MISCELLANEOUS) ×6 IMPLANT
SEAL XI 5MM-8MM UNIVERSAL (MISCELLANEOUS) ×6
SET TUBE SMOKE EVAC HIGH FLOW (TUBING) ×2 IMPLANT
SOLUTION ELECTROLUBE (MISCELLANEOUS) ×2 IMPLANT
SPIKE FLUID TRANSFER (MISCELLANEOUS) ×4 IMPLANT
SPONGE T-LAP 4X18 ~~LOC~~+RFID (SPONGE) IMPLANT
STAPLER CANNULA SEAL DVNC XI (STAPLE) ×2 IMPLANT
STAPLER CANNULA SEAL XI (STAPLE) ×2
SUT MNCRL 4-0 (SUTURE) ×2
SUT MNCRL 4-0 27XMFL (SUTURE) ×2
SUT VICRYL 0 UR6 27IN ABS (SUTURE) ×2 IMPLANT
SUTURE MNCRL 4-0 27XMF (SUTURE) ×2 IMPLANT
SYS BAG RETRIEVAL 10MM (BASKET) ×2
SYSTEM BAG RETRIEVAL 10MM (BASKET) ×2 IMPLANT
TRAP FLUID SMOKE EVACUATOR (MISCELLANEOUS) ×2 IMPLANT
WATER STERILE IRR 500ML POUR (IV SOLUTION) ×2 IMPLANT

## 2022-02-13 NOTE — Anesthesia Procedure Notes (Signed)
Procedure Name: Intubation Date/Time: 02/13/2022 9:35 AM  Performed by: Jacqualin Combes, CRNAPre-anesthesia Checklist: Patient identified, Emergency Drugs available, Suction available and Patient being monitored Patient Re-evaluated:Patient Re-evaluated prior to induction Oxygen Delivery Method: Circle system utilized Preoxygenation: Pre-oxygenation with 100% oxygen Induction Type: IV induction Ventilation: Mask ventilation without difficulty Laryngoscope Size: Mac and 4 Grade View: Grade I Tube type: Oral Tube size: 7.0 mm Number of attempts: 1 Airway Equipment and Method: Stylet and Oral airway Placement Confirmation: ETT inserted through vocal cords under direct vision, positive ETCO2 and breath sounds checked- equal and bilateral Secured at: 22 cm Tube secured with: Tape Dental Injury: Teeth and Oropharynx as per pre-operative assessment

## 2022-02-13 NOTE — Discharge Instructions (Addendum)
  Diet: Resume home heart healthy regular diet.   Activity: No heavy lifting >20 pounds (children, pets, laundry, garbage) or strenuous activity until follow-up, but light activity and walking are encouraged. Do not drive or drink alcohol if taking narcotic pain medications.  Wound care: May shower with soapy water and pat dry (do not rub incisions), but no baths or submerging incision underwater until follow-up. (no swimming)   Medications: Resume all home medications. For mild to moderate pain: acetaminophen (Tylenol) ***or ibuprofen (if no kidney disease). Combining Tylenol with alcohol can substantially increase your risk of causing liver disease. Narcotic pain medications, if prescribed, can be used for severe pain, though may cause nausea, constipation, and drowsiness. Do not combine Tylenol and Norco within a 6 hour period as Norco contains Tylenol. If you do not need the narcotic pain medication, you do not need to fill the prescription.  Call office ([phone]) at any time if any questions, worsening pain, fevers/chills, bleeding, drainage from incision site, or other concerns.   AMBULATORY SURGERY  DISCHARGE INSTRUCTIONS   The drugs that you were given will stay in your system until tomorrow so for the next 24 hours you should not:  Drive an automobile Make any legal decisions Drink any alcoholic beverage   You may resume regular meals tomorrow.  Today it is better to start with liquids and gradually work up to solid foods.  You may eat anything you prefer, but it is better to start with liquids, then soup and crackers, and gradually work up to solid foods.   Please notify your doctor immediately if you have any unusual bleeding, trouble breathing, redness and pain at the surgery site, drainage, fever, or pain not relieved by medication.    Additional Instructions:        Please contact your physician with any problems or Same Day Surgery at [phone], Monday  through Friday 6 am to 4 pm, or [place] [facility] [place] [facility] number at [phone].  

## 2022-02-13 NOTE — Interval H&P Note (Signed)
History and Physical Interval Note:  02/13/2022 8:53 AM  Jamie Weber  has presented today for surgery, with the diagnosis of K80.20 cholelithiasis w/o cholecystitis.  The various methods of treatment have been discussed with the patient and family. After consideration of risks, benefits and other options for treatment, the patient has consented to  Procedure(s): XI ROBOTIC ASSISTED LAPAROSCOPIC CHOLECYSTECTOMY (N/A) INDOCYANINE GREEN FLUORESCENCE IMAGING (ICG) (N/A) as a surgical intervention.  The patient's history has been reviewed, patient examined, no change in status, stable for surgery.  I have reviewed the patient's chart and labs.  Questions were answered to the patient's satisfaction.     Carolan Shiver

## 2022-02-13 NOTE — Transfer of Care (Signed)
Immediate Anesthesia Transfer of Care Note  Patient: Jamie Weber  Procedure(s) Performed: XI ROBOTIC ASSISTED LAPAROSCOPIC CHOLECYSTECTOMY (Abdomen) INDOCYANINE GREEN FLUORESCENCE IMAGING (ICG)  Patient Location: PACU  Anesthesia Type:General  Level of Consciousness: awake, alert  and oriented  Airway & Oxygen Therapy: Patient Spontanous Breathing and Patient connected to face mask oxygen  Post-op Assessment: Report given to RN, Post -op Vital signs reviewed and stable and Patient moving all extremities X 4  Post vital signs: stable  Last Vitals:  Vitals Value Taken Time  BP 113/64 02/13/22 1041  Temp    Pulse 84 02/13/22 1044  Resp 24 02/13/22 1044  SpO2 100 % 02/13/22 1044  Vitals shown include unvalidated device data.  Last Pain:  Vitals:   02/13/22 0757  TempSrc: Temporal  PainSc: 0-No pain         Complications: No notable events documented.

## 2022-02-13 NOTE — Op Note (Signed)
Preoperative diagnosis: Cholelithiasis  Postoperative diagnosis: Same  Procedure: Robotic Assisted Laparoscopic Cholecystectomy.   Anesthesia: GETA   Surgeon: Dr. Hazle Quant  Wound Classification: Clean Contaminated  Indications: Patient is a 36 y.o. female developed right upper quadrant pain and on workup was found to have cholelithiasis with a normal common duct. Robotic Assisted Laparoscopic cholecystectomy was elected.  Findings:  Critical view of safety achieved Cystic duct and artery identified, ligated and divided Adequate hemostasis    Description of procedure: The patient was placed on the operating table in the supine position. General anesthesia was induced. A time-out was completed verifying correct patient, procedure, site, positioning, and implant(s) and/or special equipment prior to beginning this procedure. An orogastric tube was placed. The abdomen was prepped and draped in the usual sterile fashion.  An incision was made in a natural skin line below the umbilicus.  The fascia was elevated and the Veress needle inserted. Proper position was confirmed by aspiration and saline meniscus test.  The abdomen was insufflated with carbon dioxide to a pressure of 15 mmHg. The patient tolerated insufflation well. A 8-mm trocar was then inserted in optiview fashion.  The laparoscope was inserted and the abdomen inspected. No injuries from initial trocar placement were noted. Additional trocars were then inserted in the following locations: an 8-mm trocar in the left lateral abdomen, and another two 8-mm trocars to the right side of the abdomen 5 cm appart. The umbilical trocar was changed to a 12 mm trocar all under direct visualization. The abdomen was inspected and no abnormalities were found. The table was placed in the reverse Trendelenburg position with the right side up. The robotic arms were docked and target anatomy identified. Instrument inserted under direct visualization.   Filmy adhesions between the gallbladder and omentum, duodenum and transverse colon were lysed with electrocautery. The dome of the gallbladder was grasped with a prograsp and retracted over the dome of the liver. The infundibulum was also grasped with an atraumatic grasper and retracted toward the right lower quadrant. This maneuver exposed Calot's triangle. The peritoneum overlying the gallbladder infundibulum was then incised and the cystic duct and cystic artery identified and circumferentially dissected. Critical view of safety reviewed before ligating any structure. Firefly images taken to visualize biliary ducts. The cystic duct and cystic artery were then doubly clipped and divided close to the gallbladder.  The gallbladder was then dissected from its peritoneal attachments by electrocautery. Hemostasis was checked and the gallbladder and contained stones were removed using an endoscopic retrieval bag. The gallbladder was passed off the table as a specimen. There was no evidence of bleeding from the gallbladder fossa or cystic artery or leakage of the bile from the cystic duct stump. Secondary trocars were removed under direct vision. No bleeding was noted. The robotic arms were undoked. The scope was withdrawn and the umbilical trocar removed. The abdomen was allowed to collapse. The fascia of the 36mm trocar sites was closed with figure-of-eight 0 vicryl sutures. The skin was closed with subcuticular sutures of 4-0 monocryl and topical skin adhesive. The orogastric tube was removed.  The patient tolerated the procedure well and was taken to the postanesthesia care unit in stable condition.   Specimen: Gallbladder  Complications: None  EBL: 3 mL

## 2022-02-13 NOTE — Anesthesia Postprocedure Evaluation (Signed)
Anesthesia Post Note  Patient: Jamie Weber  Procedure(s) Performed: XI ROBOTIC ASSISTED LAPAROSCOPIC CHOLECYSTECTOMY (Abdomen) INDOCYANINE GREEN FLUORESCENCE IMAGING (ICG)  Patient location during evaluation: PACU Anesthesia Type: General Level of consciousness: awake and alert Pain management: pain level controlled Vital Signs Assessment: post-procedure vital signs reviewed and stable Respiratory status: spontaneous breathing, nonlabored ventilation, respiratory function stable and patient connected to nasal cannula oxygen Cardiovascular status: blood pressure returned to baseline and stable Postop Assessment: no apparent nausea or vomiting Anesthetic complications: no   No notable events documented.   Last Vitals:  Vitals:   02/13/22 1125 02/13/22 1130  BP:  (!) 110/59  Pulse: 78 79  Resp: 17 19  Temp:  36.8 C  SpO2: 94% 93%    Last Pain:  Vitals:   02/13/22 1130  TempSrc:   PainSc: Asleep                 Louie Boston

## 2022-02-13 NOTE — Anesthesia Preprocedure Evaluation (Signed)
Anesthesia Evaluation  Patient identified by MRN, date of birth, ID band Patient awake    Reviewed: Allergy & Precautions, NPO status , Patient's Chart, lab work & pertinent test results  History of Anesthesia Complications Negative for: history of anesthetic complications (reports nausea after spinal, no issues with GA)  Airway Mallampati: II  TM Distance: >3 FB Neck ROM: full    Dental  (+) Teeth Intact   Pulmonary neg pulmonary ROS, former smoker   Pulmonary exam normal        Cardiovascular Exercise Tolerance: Good negative cardio ROS Normal cardiovascular exam     Neuro/Psych  Headaches PSYCHIATRIC DISORDERS Anxiety Depression     Neuromuscular disease    GI/Hepatic Neg liver ROS,GERD  ,,  Endo/Other  negative endocrine ROS    Renal/GU      Musculoskeletal   Abdominal  (+) + obese  Peds  Hematology negative hematology ROS (+)   Anesthesia Other Findings Past Medical History: No date: Allergy     Comment:  history of hives controoled with zyrtec No date: Anxiety No date: Depression No date: GERD (gastroesophageal reflux disease) No date: Migraines 2013: PONV (postoperative nausea and vomiting)     Comment:  from epidural from c-section  Past Surgical History: 05/1923: ABDOMINAL HYSTERECTOMY     Comment:  [facility] No date: CESAREAN SECTION 05/28/2018: CYSTOSCOPY; N/A     Comment:  Procedure: CYSTOSCOPY;  Surgeon: Vena Austria, MD;               Location: [facility] ORS;  Service: Gynecology;  Laterality:               N/A; Jun 2011: DILATION AND CURETTAGE OF UTERUS     Comment:  normal,. Kincius No date: FOOT TENDON SURGERY September 03, 2009: LAPAROSCOPY     Comment:  hysteroscopy and D&C, Dr. Harold Hedge 05/28/2018: REMOVAL OF DRUG DELIVERY IMPLANT; Left     Comment:  Procedure: REMOVAL OF DRUG DELIVERY IMPLANT-NEXPLANON;                Surgeon: Vena Austria, MD;  Location: [facility] ORS;                 Service: Gynecology;  Laterality: Left;  BMI    Body Mass Index: 40.77 kg/m      Reproductive/Obstetrics negative OB ROS                             Anesthesia Physical Anesthesia Plan  ASA: 3  Anesthesia Plan: General ETT   Post-op Pain Management: Dilaudid IV, Ofirmev IV (intra-op)* and Toradol IV (intra-op)*   Induction: Intravenous  PONV Risk Score and Plan: 3 and Ondansetron, Dexamethasone, Midazolam and Treatment may vary due to age or medical condition  Airway Management Planned: Oral ETT  Additional Equipment:   Intra-op Plan:   Post-operative Plan: Extubation in OR  Informed Consent: I have reviewed the patients History and Physical, chart, labs and discussed the procedure including the risks, benefits and alternatives for the proposed anesthesia with the patient or authorized representative who has indicated his/her understanding and acceptance.     Dental Advisory Given  Plan Discussed with: Anesthesiologist, CRNA and Surgeon  Anesthesia Plan Comments: (Patient consented for risks of anesthesia including but not limited to:  - adverse reactions to medications - damage to eyes, teeth, lips or other oral mucosa - nerve damage due to positioning  - sore throat or hoarseness - Damage  to heart, brain, nerves, lungs, other parts of body or loss of life  Patient voiced understanding.)       Anesthesia Quick Evaluation

## 2022-02-14 LAB — SURGICAL PATHOLOGY

## 2022-02-28 ENCOUNTER — Ambulatory Visit
Admission: RE | Admit: 2022-02-28 | Discharge: 2022-02-28 | Disposition: A | Discharge status: Home / Self Care | Payer: 59 | Source: Ambulatory Visit | Attending: General Surgery | Admitting: General Surgery

## 2022-02-28 ENCOUNTER — Other Ambulatory Visit: Payer: Self-pay | Admitting: General Surgery

## 2022-02-28 DIAGNOSIS — G8918 Other acute postprocedural pain: Secondary | ICD-10-CM

## 2022-02-28 DIAGNOSIS — R1032 Left lower quadrant pain: Secondary | ICD-10-CM | POA: Diagnosis present

## 2022-02-28 DIAGNOSIS — R109 Unspecified abdominal pain: Secondary | ICD-10-CM | POA: Diagnosis present

## 2022-02-28 MED ORDER — IOHEXOL 300 MG/ML  SOLN
100.0000 mL | Freq: Once | INTRAMUSCULAR | Status: AC | PRN
  Administered 2022-02-28: 100 mL via INTRAVENOUS

## 2022-12-18 ENCOUNTER — Encounter: Payer: Self-pay | Admitting: Emergency Medicine

## 2022-12-18 ENCOUNTER — Other Ambulatory Visit: Payer: Self-pay

## 2022-12-18 DIAGNOSIS — M79604 Pain in right leg: Secondary | ICD-10-CM | POA: Insufficient documentation

## 2022-12-18 NOTE — ED Triage Notes (Signed)
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

## 2022-12-19 ENCOUNTER — Emergency Department
Admission: EM | Admit: 2022-12-19 | Discharge: 2022-12-19 | Disposition: A | Discharge status: Home / Self Care | Payer: Managed Care, Other (non HMO) | Attending: Emergency Medicine | Admitting: Emergency Medicine

## 2022-12-19 ENCOUNTER — Emergency Department: Payer: Managed Care, Other (non HMO)

## 2022-12-19 DIAGNOSIS — M79604 Pain in right leg: Secondary | ICD-10-CM

## 2022-12-19 LAB — BASIC METABOLIC PANEL
Anion gap: 7 (ref 5–15)
BUN: 11 mg/dL (ref 6–20)
CO2: 25 mmol/L (ref 22–32)
Calcium: 8.5 mg/dL — ABNORMAL LOW (ref 8.9–10.3)
Chloride: 105 mmol/L (ref 98–111)
Creatinine, Ser: 0.64 mg/dL (ref 0.44–1.00)
GFR, Estimated: >60 mL/min (ref >60)
Glucose, Bld: 121 mg/dL — ABNORMAL HIGH (ref 70–99)
Potassium: 3.5 mmol/L (ref 3.5–5.1)
Sodium: 137 mmol/L (ref 135–145)

## 2022-12-19 LAB — CK: Total CK: 43 U/L (ref 38–234)

## 2022-12-19 LAB — MAGNESIUM: Magnesium: 2.3 mg/dL (ref 1.7–2.4)

## 2022-12-19 MED ORDER — KETOROLAC TROMETHAMINE 30 MG/ML IJ SOLN
15.0000 mg | Freq: Once | INTRAMUSCULAR | Status: AC
  Administered 2022-12-19: 15 mg via INTRAVENOUS
  Filled 2022-12-19: qty 1

## 2022-12-19 MED ORDER — SODIUM CHLORIDE 0.9 % IV BOLUS
1000.0000 mL | Freq: Once | INTRAVENOUS | Status: AC
  Administered 2022-12-19: 1000 mL via INTRAVENOUS

## 2022-12-19 MED ORDER — NAPROXEN 500 MG PO TABS: 500.0000 mg | ORAL_TABLET | Freq: Two times a day (BID) | ORAL | 0 refills | Status: AC

## 2022-12-19 NOTE — Discharge Instructions (Addendum)
Take anti-inflammatory as prescribed.  Apply moist heat to affected area several times daily.  Return to the ER for worsening symptoms, persistent vomiting, difficulty breathing or other concerns.

## 2022-12-19 NOTE — ED Provider Notes (Signed)
[facility] Provider Note    Event Date/Time   First MD Initiated Contact with Patient 12/19/22 0127     (approximate)   History   Leg Pain   HPI  Jamie Weber is a 37 y.o. female who presents to the ED from home with a chief complaint of nontraumatic right leg pain.  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  Reports pain to her right calf and thigh for the past several weeks.  Seeks evaluation tonight for deep-seated pain in her right groin, concerned for DVT.  Denies fever/chills, chest pain, shortness of breath, abdominal pain, nausea, vomiting or dizziness.     Past Medical History   Past Medical History:  Diagnosis Date   Allergy    history of hives controoled with zyrtec   Anxiety    Depression    GERD (gastroesophageal reflux disease)    Migraines    PONV (postoperative nausea and vomiting) 2013   from epidural from c-section     Active Problem List   Patient Active Problem List   Diagnosis Date Noted   Vaginal birth after cesarean (VBAC) 10/09/2017   Polyhydramnios in third trimester 09/27/2017   Obesity affecting pregnancy 09/14/2017   BMI 40.0-44.9, adult (HCC) 09/14/2017   History of preterm delivery, currently pregnant 04/04/2017   Supervision of high risk pregnancy, antepartum 03/02/2017   History of cesarean delivery 03/02/2017   Generalized anxiety disorder 08/07/2012   Sciatica of left side 03/03/2011   Allergy    History of migraine headaches      Past Surgical History   Past Surgical History:  Procedure Laterality Date   ABDOMINAL HYSTERECTOMY  05/1923   [facility]   CESAREAN SECTION     CYSTOSCOPY N/A 05/28/2018   Procedure: CYSTOSCOPY;  Surgeon: Vena Austria, MD;  Location: [facility] ORS;  Service: Gynecology;  Laterality: N/A;   DILATION AND CURETTAGE OF UTERUS   Jun 2011   normal,. Kincius   FOOT TENDON SURGERY     LAPAROSCOPY  September 03, 2009   hysteroscopy and D&C, Dr. Harold Hedge   REMOVAL OF DRUG DELIVERY IMPLANT Left 05/28/2018   Procedure: REMOVAL OF DRUG DELIVERY IMPLANT-NEXPLANON;  Surgeon: Vena Austria, MD;  Location: [facility] ORS;  Service: Gynecology;  Laterality: Left;     Home Medications   Prior to Admission medications   Medication Sig Start Date End Date Taking? Authorizing Provider  naproxen (NAPROSYN) 500 MG tablet Take 1 tablet (500 mg total) by mouth 2 (two) times daily with a meal. 12/19/22  Yes Irean Hong, MD  acetaminophen (TYLENOL) 500 MG tablet Take 1,000 mg by mouth every 6 (six) hours as needed.    [provider]  hydrOXYzine (ATARAX) 10 MG tablet Take 10 mg by mouth 2 (two) times daily.    [provider]     Allergies  Latex, Mushroom extract complex, and Other   Family History   Family History  Problem Relation Age of Onset   Hypothyroidism Father    Cancer Maternal Grandmother        bilateral breast ca   Hyperlipidemia Paternal Grandmother    Heart disease Paternal Grandfather      Physical Exam  Triage Vital Signs: ED Triage Vitals  Encounter Vitals Group     BP 12/18/22 2355 115/71  Systolic BP Percentile --      Diastolic BP Percentile --      Pulse Rate 12/18/22 2355 74     Resp 12/18/22 2355 18     Temp 12/18/22 2355 98 F (36.7 C)     Temp Source 12/18/22 2355 Oral     SpO2 12/18/22 2355 97 %     Weight 12/18/22 2347 238 lb (108 kg)     Height 12/18/22 2347 5\' 5"  (1.651 m)     Head Circumference --      Peak Flow --      Pain Score 12/18/22 2347 7     Pain Loc --      Pain Education --      Exclude from Growth Chart --     Updated Vital Signs: BP (!) 106/55 (BP Location: Left Arm)   Pulse 62   Temp 97.8 F (36.6 C) (Oral)   Resp 16   Ht 5\' 5"  (1.651 m)   Wt 108 kg   LMP 12/17/2017 (Approximate)   SpO2 99%   BMI 39.61 kg/m    General: Awake, no  distress.  CV:  RRR.  Good peripheral perfusion.  Resp:  Normal effort.  CTAB. Abd:  No distention.  Other:  RLE: 2+ femoral distal pulses.  Symmetrically warm limb without evidence for ischemia.  No calf tenderness or swelling.  No inguinal mass or swelling.  No bruising noted.  Brisk, less than 5-second capillary refill.   ED Results / Procedures / Treatments  Labs (all labs ordered are listed, but only abnormal results are displayed) Labs Reviewed  BASIC METABOLIC PANEL - Abnormal; Notable for the following components:      Result Value   Glucose, Bld 121 (*)    Calcium 8.5 (*)    All other components within normal limits  MAGNESIUM  CK     EKG  None   RADIOLOGY I have independently visualized and interpreted patient's ultrasound as well as noted the radiology interpretation:  UltraSound: No DVT  Official radiology report(s): US Venous Img Lower Unilateral Right  Result Date: 12/19/2022 CLINICAL DATA:  Right leg pain EXAM: RIGHT LOWER EXTREMITY VENOUS DOPPLER ULTRASOUND TECHNIQUE: Gray-scale sonography with graded compression, as well as color Doppler and duplex ultrasound were performed to evaluate the lower extremity deep venous systems from the level of the common femoral vein and including the common femoral, femoral, profunda femoral, popliteal and calf veins including the posterior tibial, peroneal and gastrocnemius veins when visible. The superficial great saphenous vein was also interrogated. Spectral Doppler was utilized to evaluate flow at rest and with distal augmentation maneuvers in the common femoral, femoral and popliteal veins. COMPARISON:  None Available. FINDINGS: Contralateral Common Femoral Vein: Respiratory phasicity is normal and symmetric with the symptomatic side. No evidence of thrombus. Normal compressibility. Common Femoral Vein: No evidence of thrombus. Normal compressibility, respiratory phasicity and response to augmentation. Saphenofemoral Junction:  No evidence of thrombus. Normal compressibility and flow on color Doppler imaging. Profunda Femoral Vein: No evidence of thrombus. Normal compressibility and flow on color Doppler imaging. Femoral Vein: No evidence of thrombus. Normal compressibility, respiratory phasicity and response to augmentation. Popliteal Vein: No evidence of thrombus. Normal compressibility, respiratory phasicity and response to augmentation. Calf Veins: No evidence of thrombus. Normal compressibility and flow on color Doppler imaging. Superficial Great Saphenous Vein: No evidence of thrombus. Normal compressibility. Venous Reflux:  None. Other Findings:  None. IMPRESSION: No evidence of deep venous thrombosis. Electronically Signed  By: Alcide Clever M.D.   On: 12/19/2022 00:41     PROCEDURES:  Critical Care performed: No  Procedures   MEDICATIONS ORDERED IN ED: Medications  sodium chloride 0.9 % bolus 1,000 mL (0 mLs Intravenous Stopped 12/19/22 0314)  ketorolac (TORADOL) 30 MG/ML injection 15 mg (15 mg Intravenous Given 12/19/22 0203)     IMPRESSION / MDM / ASSESSMENT AND PLAN / ED COURSE  I reviewed the triage vital signs and the nursing notes.                             38 year old female presenting with right calf and groin pain times several weeks.  Differential diagnosis includes but is not limited to musculoskeletal, DVT, arterial occlusion, electrolyte imbalance, rhabdomyolysis, etc.  I personally reviewed patient's records and note a PCP annual physical exam on 11/24/2022.  Patient's presentation is most consistent with acute complicated illness / injury requiring diagnostic workup.  Ultrasound is negative for DVT.  Will check electrolytes, CK, initiate IV fluid hydration, IV ketorolac for pain and reassess.  0234 Electrolytes, magnesium and CK unremarkable.  Patient feeling significantly better.  Will prescribe NSAIDs, discharge home with follow-up with her PCP.  Strict return precautions given.  Patient  verbalizes understanding and agrees with plan of care.  FINAL CLINICAL IMPRESSION(S) / ED DIAGNOSES   Final diagnoses:  Pain of right lower extremity     Rx / DC Orders   ED Discharge Orders          Ordered    naproxen (NAPROSYN) 500 MG tablet  2 times daily with meals        12/19/22 0235             Note:  This document was prepared using Dragon voice recognition software and may include unintentional dictation errors.   Irean Hong, MD 12/19/22 0330

## 2023-06-22 ENCOUNTER — Other Ambulatory Visit: Payer: Self-pay | Admitting: Student

## 2023-06-22 DIAGNOSIS — R519 Headache, unspecified: Secondary | ICD-10-CM

## 2023-06-22 DIAGNOSIS — R4189 Other symptoms and signs involving cognitive functions and awareness: Secondary | ICD-10-CM

## 2023-06-30 ENCOUNTER — Ambulatory Visit
Admission: RE | Admit: 2023-06-30 | Discharge: 2023-06-30 | Disposition: A | Discharge status: Home / Self Care | Source: Ambulatory Visit | Attending: Student | Admitting: Student

## 2023-06-30 DIAGNOSIS — R519 Headache, unspecified: Secondary | ICD-10-CM

## 2023-06-30 DIAGNOSIS — R4189 Other symptoms and signs involving cognitive functions and awareness: Secondary | ICD-10-CM

## 2023-07-17 ENCOUNTER — Ambulatory Visit: Admitting: Obstetrics and Gynecology

## 2023-07-27 ENCOUNTER — Ambulatory Visit: Admitting: Obstetrics and Gynecology

## 2023-08-24 ENCOUNTER — Encounter: Admitting: Licensed Practical Nurse

## 2023-09-12 ENCOUNTER — Encounter: Admitting: Licensed Practical Nurse

## 2023-10-29 ENCOUNTER — Encounter: Admitting: Licensed Practical Nurse

## 2024-01-01 ENCOUNTER — Encounter: Payer: Self-pay | Admitting: Cardiovascular Disease

## 2024-01-01 ENCOUNTER — Ambulatory Visit: Attending: Cardiovascular Disease | Admitting: Cardiovascular Disease

## 2024-01-01 VITALS — BP 112/66 | HR 94 | Ht 65.0 in | Wt 254.0 lb

## 2024-01-01 DIAGNOSIS — Z8249 Family history of ischemic heart disease and other diseases of the circulatory system: Secondary | ICD-10-CM | POA: Diagnosis not present

## 2024-01-01 DIAGNOSIS — E782 Mixed hyperlipidemia: Secondary | ICD-10-CM

## 2024-01-01 DIAGNOSIS — I499 Cardiac arrhythmia, unspecified: Secondary | ICD-10-CM | POA: Diagnosis not present

## 2024-01-01 DIAGNOSIS — E785 Hyperlipidemia, unspecified: Secondary | ICD-10-CM | POA: Insufficient documentation

## 2024-01-01 NOTE — Assessment & Plan Note (Signed)
 BMI 42.  Scheduled to see an MD to discuss GLP-1 agonist.

## 2024-01-01 NOTE — Assessment & Plan Note (Signed)
 Father has known CAD at age 36.

## 2024-01-01 NOTE — Patient Instructions (Signed)
 Medication Instructions:  Your physician recommends that you continue on your current medications as directed. Please refer to the Current Medication list given to you today.  *If you need a refill on your cardiac medications before your next appointment, please call your pharmacy*  Testing/Procedures: Dr. Court has ordered a CT coronary calcium score.   Test locations:  [facility]  [facility] at [facility]  This is $99 out of pocket.   Coronary CalciumScan A coronary calcium scan is an imaging test used to look for deposits of calcium and other fatty materials (plaques) in the inner lining of the blood vessels of the heart (coronary arteries). These deposits of calcium and plaques can partly clog and narrow the coronary arteries without producing any symptoms or warning signs. This puts a person at risk for a heart attack. This test can detect these deposits before symptoms develop. Tell a health care provider about: Any allergies you have. All medicines you are taking, including vitamins, herbs, eye drops, creams, and over-the-counter medicines. Any problems you or family members have had with anesthetic medicines. Any blood disorders you have. Any surgeries you have had. Any medical conditions you have. Whether you are pregnant or may be pregnant. What are the risks? Generally, this is a safe procedure. However, problems may occur, including: Harm to a pregnant woman and her unborn baby. This test involves the use of radiation. Radiation exposure can be dangerous to a pregnant woman and her unborn baby. If you are pregnant, you generally should not have this procedure done. Slight increase in the risk of cancer. This is because of the radiation involved in the test. What happens before the procedure? No preparation is needed for this procedure. What happens  during the procedure? You will undress and remove any jewelry around your neck or chest. You will put on a hospital gown. Sticky electrodes will be placed on your chest. The electrodes will be connected to an electrocardiogram (ECG) machine to record a tracing of the electrical activity of your heart. A CT scanner will take pictures of your heart. During this time, you will be asked to lie still and hold your breath for 2-3 seconds while a picture of your heart is being taken. The procedure may vary among health care providers and hospitals. What happens after the procedure? You can get dressed. You can return to your normal activities. It is up to you to get the results of your test. Ask your health care provider, or the department that is doing the test, when your results will be ready. Summary A coronary calcium scan is an imaging test used to look for deposits of calcium and other fatty materials (plaques) in the inner lining of the blood vessels of the heart (coronary arteries). Generally, this is a safe procedure. Tell your health care provider if you are pregnant or may be pregnant. No preparation is needed for this procedure. A CT scanner will take pictures of your heart. You can return to your normal activities after the scan is done. This information is not intended to replace advice given to you by your health care provider. Make sure you discuss any questions you have with your health care provider. Document Released: 09/16/2007 Document Revised: 02/07/2016 Document Reviewed: 02/07/2016 Elsevier Interactive Patient Education  2017 [employer].   Follow-Up: At [facility], you and your health needs are our priority.  As  part of our continuing mission to provide you with exceptional heart care, our providers are all part of one team.  This team includes your primary Cardiologist (physician) and Advanced Practice Providers or APPs (Physician Assistants and Nurse  Practitioners) who all work together to provide you with the care you need, when you need it.  Your next appointment:   12 month(s)  Provider:   Dorn Lesches, MD

## 2024-01-01 NOTE — Progress Notes (Signed)
 01/01/2024 Jamie Weber   03-29-1986  [id number]  Primary Physician Sherial Bail, MD Primary Cardiologist: Dorn JINNY Lesches MD [facility] [place], [place]  HPI:  Jamie Weber is a 38 y.o. morbidly overweight married Caucasian female mother of 3 children who works as a Water quality scientist and is self-referred for cardiovascular dilation because of risk factors.  She does have a history of mild hyperlipidemia not on medical therapy.  Her father has known CAD being treated medically.  She is never had a heart attack or stroke.  She denies chest pain or shortness of breath.  She is completely asymptomatic and walks up to 7000 steps a day on her job.  She also has GERD.   Current Meds  Medication Sig   acetaminophen  (TYLENOL ) 500 MG tablet Take 1,000 mg by mouth every 6 (six) hours as needed.   hydrOXYzine (ATARAX) 10 MG tablet Take 10 mg by mouth 2 (two) times daily.   naproxen  (NAPROSYN ) 500 MG tablet Take 1 tablet (500 mg total) by mouth 2 (two) times daily with a meal.     Allergies  Allergen Reactions   Latex Swelling   Mushroom Extract Complex (Obsolete) Itching, Nausea Only and Swelling   Other Swelling    Lobster    Social History   Socioeconomic History   Marital status: Married    Spouse name: Eva   Number of children: 3   Years of education: Not on file   Highest education level: Not on file  Occupational History   Not on file  Tobacco Use   Smoking status: Former    Current packs/day: 0.00    Types: Cigarettes    Quit date: 10/03/2010    Years since quitting: 13.2   Smokeless tobacco: Never  Vaping Use   Vaping status: Never Used  Substance and Sexual Activity   Alcohol use: Not Currently    Alcohol/week: 1.0 - 2.0 standard drink of alcohol    Types: 1 - 2 Standard drinks or equivalent per week    Comment: occasional   Drug use: No   Sexual activity: Yes    Birth control/protection: Rhythm  Other Topics Concern   Not on file  Social History  Narrative   Not on file   Social Drivers of Health   Financial Resource Strain: Not on file  Food Insecurity: Not on file  Transportation Needs: Not on file  Physical Activity: Not on file  Stress: Not on file  Social Connections: Not on file  Intimate Partner Violence: Not on file     Review of Systems: General: negative for chills, fever, night sweats or weight changes.  Cardiovascular: negative for chest pain, dyspnea on exertion, edema, orthopnea, palpitations, paroxysmal nocturnal dyspnea or shortness of breath Dermatological: negative for rash Respiratory: negative for cough or wheezing Urologic: negative for hematuria Abdominal: negative for nausea, vomiting, diarrhea, bright red blood per rectum, melena, or hematemesis Neurologic: negative for visual changes, syncope, or dizziness All other systems reviewed and are otherwise negative except as noted above.    Blood pressure 112/66, pulse 94, height 5' 5 (1.651 m), weight 254 lb (115.2 kg), last menstrual period 12/17/2017, SpO2 97%.  General appearance: alert and no distress Neck: no adenopathy, no carotid bruit, no JVD, supple, symmetrical, trachea midline, and thyroid not enlarged, symmetric, no tenderness/mass/nodules Lungs: clear to auscultation bilaterally Heart: regular rate and rhythm, S1, S2 normal, no murmur, click, rub or gallop Extremities: extremities normal, atraumatic, no cyanosis or edema  Pulses: 2+ and symmetric Skin: Skin color, texture, turgor normal. No rashes or lesions Neurologic: Grossly normal  EKG EKG Interpretation Date/Time:  Tuesday January 01 2024 14:06:09 EDT Ventricular Rate:  75 PR Interval:  132 QRS Duration:  84 QT Interval:  362 QTC Calculation: 404 R Axis:   60  Text Interpretation: Normal sinus rhythm with sinus arrhythmia Normal ECG No previous ECGs available Confirmed by Court Carrier [id number]) on 01/01/2024 2:07:25 PM    ASSESSMENT AND PLAN:   Morbid obesity (HCC) BMI  42.  Scheduled to see an MD to discuss GLP-1 agonist.  Hyperlipidemia Her most recent lipid profile performed by her PCP 11/24/2022 revealed total cholesterol 180, LDL of 115 and HDL of 44.  I am going to get a coronary calcium score to her stratify.  Family history of heart disease Father has known CAD at age 65.     Carrier DOROTHA Court MD FACP,FACC,FAHA, [facility] 01/01/2024 2:19 PM

## 2024-01-01 NOTE — Assessment & Plan Note (Signed)
 Her most recent lipid profile performed by her PCP 11/24/2022 revealed total cholesterol 180, LDL of 115 and HDL of 44.  I am going to get a coronary calcium score to her stratify.

## 2024-02-01 ENCOUNTER — Ambulatory Visit (HOSPITAL_COMMUNITY): Payer: Self-pay

## 2024-03-04 ENCOUNTER — Telehealth: Payer: Self-pay | Admitting: Cardiovascular Disease

## 2024-03-04 NOTE — Telephone Encounter (Signed)
 Pt requesting call back to reschedule CT she had scheduled for 12/5. Please advise.

## 2024-03-07 ENCOUNTER — Ambulatory Visit (HOSPITAL_COMMUNITY): Payer: Self-pay

## 2024-03-07 ENCOUNTER — Encounter (HOSPITAL_COMMUNITY): Payer: Self-pay

## 2024-03-11 ENCOUNTER — Emergency Department (HOSPITAL_COMMUNITY)

## 2024-03-11 ENCOUNTER — Emergency Department (HOSPITAL_COMMUNITY)
Admission: EM | Admit: 2024-03-11 | Discharge: 2024-03-11 | Disposition: A | Discharge status: Home / Self Care | Attending: Emergency Medicine | Admitting: Emergency Medicine

## 2024-03-11 DIAGNOSIS — R1010 Upper abdominal pain, unspecified: Secondary | ICD-10-CM

## 2024-03-11 LAB — CBC
HCT: 40.9 % (ref 36.0–46.0)
Hemoglobin: 13.4 g/dL (ref 12.0–15.0)
MCH: 29.7 pg (ref 26.0–34.0)
MCHC: 32.8 g/dL (ref 30.0–36.0)
MCV: 90.7 fL (ref 80.0–100.0)
Platelets: 327 K/uL (ref 150–400)
RBC: 4.51 MIL/uL (ref 3.87–5.11)
RDW: 12.5 % (ref 11.5–15.5)
WBC: 9 K/uL (ref 4.0–10.5)
nRBC: 0 % (ref 0.0–0.2)

## 2024-03-11 LAB — COMPREHENSIVE METABOLIC PANEL WITH GFR
ALT: 39 U/L (ref 0–44)
AST: 17 U/L (ref 15–41)
Albumin: 4.3 g/dL (ref 3.5–5.0)
Alkaline Phosphatase: 59 U/L (ref 38–126)
Anion gap: 13 (ref 5–15)
BUN: 10 mg/dL (ref 6–20)
CO2: 23 mmol/L (ref 22–32)
Calcium: 9.4 mg/dL (ref 8.9–10.3)
Chloride: 103 mmol/L (ref 98–111)
Creatinine, Ser: 0.62 mg/dL (ref 0.44–1.00)
GFR, Estimated: >60 mL/min (ref >60)
Glucose, Bld: 111 mg/dL — ABNORMAL HIGH (ref 70–99)
Potassium: 4 mmol/L (ref 3.5–5.1)
Sodium: 139 mmol/L (ref 135–145)
Total Bilirubin: 0.2 mg/dL (ref 0.0–1.2)
Total Protein: 6.9 g/dL (ref 6.5–8.1)

## 2024-03-11 LAB — URINALYSIS, ROUTINE W REFLEX MICROSCOPIC
Bilirubin Urine: NEGATIVE
Glucose, UA: NEGATIVE mg/dL
Hgb urine dipstick: NEGATIVE
Ketones, ur: NEGATIVE mg/dL
Leukocytes,Ua: NEGATIVE
Nitrite: NEGATIVE
Protein, ur: NEGATIVE mg/dL
Specific Gravity, Urine: 1.009 (ref 1.005–1.030)
pH: 6 (ref 5.0–8.0)

## 2024-03-11 LAB — HCG, QUANTITATIVE, PREGNANCY: hCG, Beta Chain, Quant, S: <1 m[IU]/mL (ref <5)

## 2024-03-11 LAB — LIPASE, BLOOD: Lipase: 35 U/L (ref 11–51)

## 2024-03-11 MED ORDER — PANTOPRAZOLE SODIUM 40 MG IV SOLR
40.0000 mg | Freq: Once | INTRAVENOUS | Status: AC
  Administered 2024-03-11: 40 mg via INTRAVENOUS
  Filled 2024-03-11: qty 10

## 2024-03-11 MED ORDER — IOHEXOL 300 MG/ML  SOLN
100.0000 mL | Freq: Once | INTRAMUSCULAR | Status: AC | PRN
  Administered 2024-03-11: 100 mL via INTRAVENOUS

## 2024-03-11 MED ORDER — FAMOTIDINE 20 MG PO TABS: 20.0000 mg | ORAL_TABLET | Freq: Two times a day (BID) | ORAL | 1 refills | Status: AC

## 2024-03-11 NOTE — ED Triage Notes (Signed)
 Pt complains of abd pain x 3 days constant pain that hurts worse after everything she eats or drinks. Pain is RUQ and in back. Pt sates she had gallbladder removed 2 years ago and has been having pains on and off since then and but worse the past few days.  can not get into GI doctor until April 2026. Nausea without vomiting.

## 2024-03-11 NOTE — Discharge Instructions (Signed)
 Follow-up with Dr. Eartha or one of his colleagues in the next 1 to 2 weeks

## 2024-03-12 NOTE — ED Provider Notes (Signed)
  EMERGENCY DEPARTMENT AT [facility] Provider Note   CSN: [id number] Arrival date & time: 03/11/24  1649     Patient presents with: Abdominal Pain   Jamie Weber is a 38 y.o. female.   Patient complains of epigastric discomfort.  She is on Carafate by her own GI doctor.  She states the pain seems to be getting worse.  Her GI doctor cannot see her till April.  The history is provided by the patient and medical records.  Abdominal Pain Pain location:  Epigastric Pain quality: aching   Pain radiates to:  Does not radiate Pain severity:  Moderate Onset quality:  Sudden Timing:  Constant Progression:  Waxing and waning Context: not alcohol use   Relieved by:  Nothing Worsened by:  Nothing Ineffective treatments:  None tried Associated symptoms: no chest pain, no cough, no diarrhea, no fatigue and no hematuria        Prior to Admission medications   Medication Sig Start Date End Date Taking? Authorizing Provider  famotidine  (PEPCID ) 20 MG tablet Take 1 tablet (20 mg total) by mouth 2 (two) times daily. 03/11/24  Yes Marlaine Arey, MD  acetaminophen  (TYLENOL ) 500 MG tablet Take 1,000 mg by mouth every 6 (six) hours as needed.    [provider]  hydrOXYzine (ATARAX) 10 MG tablet Take 10 mg by mouth 2 (two) times daily.    [provider]  naproxen  (NAPROSYN ) 500 MG tablet Take 1 tablet (500 mg total) by mouth 2 (two) times daily with a meal. 12/19/22   Robinette Vermell PARAS, MD    Allergies: Latex, Mushroom extract complex (obsolete), and Other    Review of Systems  Constitutional:  Negative for appetite change and fatigue.  HENT:  Negative for congestion, ear discharge and sinus pressure.   Eyes:  Negative for discharge.  Respiratory:  Negative for cough.   Cardiovascular:  Negative for chest pain.  Gastrointestinal:  Positive for abdominal pain. Negative for diarrhea.  Genitourinary:  Negative for frequency and hematuria.  Musculoskeletal:   Negative for back pain.  Skin:  Negative for rash.  Neurological:  Negative for seizures and headaches.  Psychiatric/Behavioral:  Negative for hallucinations.     Updated Vital Signs BP 137/82 (BP Location: Right Arm)   Pulse 82   Temp 98.7 F (37.1 C) (Oral)   Resp 18   LMP 12/17/2017 (Approximate)   SpO2 100%   Physical Exam Vitals and nursing note reviewed.  Constitutional:      Appearance: She is well-developed.  HENT:     Head: Normocephalic.     Nose: Nose normal.  Eyes:     General: No scleral icterus.    Conjunctiva/sclera: Conjunctivae normal.  Neck:     Thyroid: No thyromegaly.  Cardiovascular:     Rate and Rhythm: Normal rate and regular rhythm.     Heart sounds: No murmur heard.    No friction rub. No gallop.  Pulmonary:     Breath sounds: No stridor. No wheezing or rales.  Chest:     Chest wall: No tenderness.  Abdominal:     General: There is no distension.     Tenderness: There is abdominal tenderness. There is no rebound.  Musculoskeletal:        General: Normal range of motion.     Cervical back: Neck supple.  Lymphadenopathy:     Cervical: No cervical adenopathy.  Skin:    Findings: No erythema or rash.  Neurological:  Mental Status: She is alert and oriented to person, place, and time.     Motor: No abnormal muscle tone.     Coordination: Coordination normal.  Psychiatric:        Behavior: Behavior normal.     (all labs ordered are listed, but only abnormal results are displayed) Labs Reviewed  COMPREHENSIVE METABOLIC PANEL WITH GFR - Abnormal; Notable for the following components:      Result Value   Glucose, Bld 111 (*)    All other components within normal limits  URINALYSIS, ROUTINE W REFLEX MICROSCOPIC - Abnormal; Notable for the following components:   APPearance HAZY (*)    All other components within normal limits  LIPASE, BLOOD  CBC  HCG, QUANTITATIVE, PREGNANCY    EKG: None  Radiology: CT ABDOMEN PELVIS W  CONTRAST Result Date: 03/11/2024 EXAM: CT ABDOMEN AND PELVIS WITH CONTRAST 03/11/2024 06:58:13 PM TECHNIQUE: CT of the abdomen and pelvis was performed with the administration of 100 mL of iohexol  (OMNIPAQUE ) 300 MG/ML solution. Multiplanar reformatted images are provided for review. Automated exposure control, iterative reconstruction, and/or weight-based adjustment of the mA/kV was utilized to reduce the radiation dose to as low as reasonably achievable. COMPARISON: 02/28/2022 CLINICAL HISTORY: Abdominal pain, acute, nonlocalized. FINDINGS: LOWER CHEST: No acute abnormality. LIVER: The liver is unremarkable. GALLBLADDER AND BILE DUCTS: Status post cholecystectomy. No biliary ductal dilatation. SPLEEN: No acute abnormality. PANCREAS: No acute abnormality. ADRENAL GLANDS: No acute abnormality. KIDNEYS, URETERS AND BLADDER: No stones in the kidneys or ureters. No hydronephrosis. No perinephric or periureteral stranding. Urinary bladder is unremarkable. GI AND BOWEL: Stomach demonstrates no acute abnormality. There is no bowel obstruction. Normal appendix (image 63). PERITONEUM AND RETROPERITONEUM: No ascites. No free air. VASCULATURE: Aorta is normal in caliber. LYMPH NODES: No lymphadenopathy. REPRODUCTIVE ORGANS: Status post hysterectomy. BONES AND SOFT TISSUES: No acute osseous abnormality. No focal soft tissue abnormality. IMPRESSION: 1. No acute findings in the abdomen or pelvis. Electronically signed by: Pinkie Pebbles MD 03/11/2024 07:11 PM EST RP Workstation: [id number]     Procedures   Medications Ordered in the ED  pantoprazole  (PROTONIX ) injection 40 mg (40 mg Intravenous Given 03/11/24 1826)  iohexol  (OMNIPAQUE ) 300 MG/ML solution 100 mL (100 mLs Intravenous Contrast Given 03/11/24 1850)                                    Medical Decision Making Amount and/or Complexity of Data Reviewed Labs: ordered. Radiology: ordered.  Risk Prescription drug management.   Patient with abdominal  pain and most likely  peptic ulcer disease.  She will continue the Carafate and is started on Pepcid  and is referred to GI here     Final diagnoses:  Pain of upper abdomen    ED Discharge Orders          Ordered    famotidine  (PEPCID ) 20 MG tablet  2 times daily        03/11/24 2037               Suzette Pac, MD 03/12/24 1206
